# Patient Record
Sex: Female | Born: 1960 | Race: Black or African American | Hispanic: No | Marital: Single | Smoking: Former smoker
Health system: Southern US, Community
[De-identification: ages and names within clinical notes are randomized; demographics above are authoritative.]

## PROBLEM LIST (undated history)

## (undated) DIAGNOSIS — I10 Essential (primary) hypertension: Secondary | ICD-10-CM

## (undated) DIAGNOSIS — E119 Type 2 diabetes mellitus without complications: Secondary | ICD-10-CM

## (undated) DIAGNOSIS — E785 Hyperlipidemia, unspecified: Secondary | ICD-10-CM

## (undated) HISTORY — DX: Hyperlipidemia, unspecified: E78.5

## (undated) HISTORY — DX: Type 2 diabetes mellitus without complications: E11.9

---

## 2001-04-12 ENCOUNTER — Emergency Department (HOSPITAL_COMMUNITY): Admission: EM | Admit: 2001-04-12 | Discharge: 2001-04-12 | Payer: Self-pay | Admitting: Emergency Medicine

## 2001-04-26 ENCOUNTER — Emergency Department (HOSPITAL_COMMUNITY): Admission: AC | Admit: 2001-04-26 | Discharge: 2001-04-26 | Payer: Self-pay

## 2001-04-26 ENCOUNTER — Encounter: Payer: Self-pay | Admitting: Emergency Medicine

## 2014-07-14 ENCOUNTER — Encounter (HOSPITAL_COMMUNITY): Payer: Self-pay | Admitting: *Deleted

## 2014-07-14 ENCOUNTER — Emergency Department (HOSPITAL_COMMUNITY)
Admission: EM | Admit: 2014-07-14 | Discharge: 2014-07-14 | Disposition: A | Discharge status: Home / Self Care | Payer: PRIVATE HEALTH INSURANCE | Attending: Emergency Medicine | Admitting: Emergency Medicine

## 2014-07-14 ENCOUNTER — Emergency Department (HOSPITAL_COMMUNITY): Payer: PRIVATE HEALTH INSURANCE

## 2014-07-14 DIAGNOSIS — Z3202 Encounter for pregnancy test, result negative: Secondary | ICD-10-CM | POA: Diagnosis not present

## 2014-07-14 DIAGNOSIS — E860 Dehydration: Secondary | ICD-10-CM | POA: Insufficient documentation

## 2014-07-14 DIAGNOSIS — J069 Acute upper respiratory infection, unspecified: Secondary | ICD-10-CM | POA: Diagnosis not present

## 2014-07-14 DIAGNOSIS — I10 Essential (primary) hypertension: Secondary | ICD-10-CM | POA: Diagnosis not present

## 2014-07-14 DIAGNOSIS — R55 Syncope and collapse: Secondary | ICD-10-CM | POA: Diagnosis not present

## 2014-07-14 DIAGNOSIS — R05 Cough: Secondary | ICD-10-CM | POA: Diagnosis present

## 2014-07-14 DIAGNOSIS — R0602 Shortness of breath: Secondary | ICD-10-CM

## 2014-07-14 HISTORY — DX: Essential (primary) hypertension: I10

## 2014-07-14 LAB — URINALYSIS, ROUTINE W REFLEX MICROSCOPIC
Bilirubin Urine: NEGATIVE
Glucose, UA: >1000 mg/dL — AB
Hgb urine dipstick: NEGATIVE
Ketones, ur: 15 mg/dL — AB
Leukocytes, UA: NEGATIVE
Nitrite: NEGATIVE
Protein, ur: NEGATIVE mg/dL
Specific Gravity, Urine: 1.026 (ref 1.005–1.030)
Urobilinogen, UA: 0.2 mg/dL (ref 0.0–1.0)
pH: 7 (ref 5.0–8.0)

## 2014-07-14 LAB — BASIC METABOLIC PANEL
ANION GAP: 10 (ref 5–15)
BUN: 7 mg/dL (ref 6–23)
CO2: 22 mmol/L (ref 19–32)
Calcium: 9.2 mg/dL (ref 8.4–10.5)
Chloride: 101 mEq/L (ref 96–112)
Creatinine, Ser: 0.77 mg/dL (ref 0.50–1.10)
GFR calc Af Amer: >90 mL/min (ref >90)
GLUCOSE: 308 mg/dL — AB (ref 70–99)
Potassium: 4.4 mmol/L (ref 3.5–5.1)
SODIUM: 133 mmol/L — AB (ref 135–145)

## 2014-07-14 LAB — CBC
HCT: 34.5 % — ABNORMAL LOW (ref 36.0–46.0)
Hemoglobin: 11.1 g/dL — ABNORMAL LOW (ref 12.0–15.0)
MCH: 25.4 pg — ABNORMAL LOW (ref 26.0–34.0)
MCHC: 32.2 g/dL (ref 30.0–36.0)
MCV: 78.9 fL (ref 78.0–100.0)
Platelets: 225 10*3/uL (ref 150–400)
RBC: 4.37 MIL/uL (ref 3.87–5.11)
RDW: 12.1 % (ref 11.5–15.5)
WBC: 4.5 10*3/uL (ref 4.0–10.5)

## 2014-07-14 LAB — URINE MICROSCOPIC-ADD ON

## 2014-07-14 LAB — I-STAT TROPONIN, ED: TROPONIN I, POC: 0 ng/mL (ref 0.00–0.08)

## 2014-07-14 LAB — POC URINE PREG, ED: Preg Test, Ur: NEGATIVE

## 2014-07-14 MED ORDER — BENZONATATE 100 MG PO CAPS: 100.0000 mg | ORAL_CAPSULE | Freq: Three times a day (TID) | ORAL | Status: DC

## 2014-07-14 MED ORDER — ALBUTEROL SULFATE HFA 108 (90 BASE) MCG/ACT IN AERS
2.0000 | INHALATION_SPRAY | Freq: Once | RESPIRATORY_TRACT | Status: AC
  Administered 2014-07-14: 2 via RESPIRATORY_TRACT
  Filled 2014-07-14: qty 6.7

## 2014-07-14 MED ORDER — IBUPROFEN 800 MG PO TABS
800.0000 mg | ORAL_TABLET | Freq: Once | ORAL | Status: AC
  Administered 2014-07-14: 800 mg via ORAL
  Filled 2014-07-14: qty 1

## 2014-07-14 MED ORDER — SODIUM CHLORIDE 0.9 % IV BOLUS (SEPSIS)
1000.0000 mL | Freq: Once | INTRAVENOUS | Status: AC
Start: 2014-07-14 — End: 2014-07-14
  Administered 2014-07-14: 1000 mL via INTRAVENOUS

## 2014-07-14 NOTE — Discharge Instructions (Signed)
Return to the emergency room with worsening of symptoms, new symptoms or with symptoms that are concerning, especially chest pain, shortness of breath, feel faint, pass out, severe worsening of headache, visual or speech changes, weakness in face, arms or legs. Call to make appointment with your primary care provider as soon as possible to refill your hypertension medicines and also talk about the elevated glucose level while here. Drink plenty of fluids with electrolytes especially Gatorade. OTC cold medications such as mucinex, nyquil, dayquil are recommended. Chloraseptic for sore throat.   Cough, Adult  A cough is a reflex that helps clear your throat and airways. It can help heal the body or may be a reaction to an irritated airway. A cough may only last 2 or 3 weeks (acute) or may last more than 8 weeks (chronic).  CAUSES Acute cough:  Viral or bacterial infections. Chronic cough:  Infections.  Allergies.  Asthma.  Post-nasal drip.  Smoking.  Heartburn or acid reflux.  Some medicines.  Chronic lung problems (COPD).  Cancer. SYMPTOMS   Cough.  Fever.  Chest pain.  Increased breathing rate.  High-pitched whistling sound when breathing (wheezing).  Colored mucus that you cough up (sputum). TREATMENT   A bacterial cough may be treated with antibiotic medicine.  A viral cough must run its course and will not respond to antibiotics.  Your caregiver may recommend other treatments if you have a chronic cough. HOME CARE INSTRUCTIONS   Only take over-the-counter or prescription medicines for pain, discomfort, or fever as directed by your caregiver. Use cough suppressants only as directed by your caregiver.  Use a cold steam vaporizer or humidifier in your bedroom or home to help loosen secretions.  Sleep in a semi-upright position if your cough is worse at night.  Rest as needed.  Stop smoking if you smoke. SEEK IMMEDIATE MEDICAL CARE IF:   You have pus in  your sputum.  Your cough starts to worsen.  You cannot control your cough with suppressants and are losing sleep.  You begin coughing up blood.  You have difficulty breathing.  You develop pain which is getting worse or is uncontrolled with medicine.  You have a fever. MAKE SURE YOU:   Understand these instructions.  Will watch your condition.  Will get help right away if you are not doing well or get worse. Document Released: 12/24/2010 Document Revised: 09/19/2011 Document Reviewed: 12/24/2010 [facility] Patient Information 2015 [place], [place]. This information is not intended to replace advice given to you by your health care provider. Make sure you discuss any questions you have with your health care provider.    Near-Syncope Near-syncope (commonly known as near fainting) is sudden weakness, dizziness, or feeling like you might pass out. During an episode of near-syncope, you may also develop pale skin, have tunnel vision, or feel sick to your stomach (nauseous). Near-syncope may occur when getting up after sitting or while standing for a long time. It is caused by a sudden decrease in blood flow to the brain. This decrease can result from various causes or triggers, most of which are not serious. However, because near-syncope can sometimes be a sign of something serious, a medical evaluation is required. The specific cause is often not determined. HOME CARE INSTRUCTIONS  Monitor your condition for any changes. The following actions may help to alleviate any discomfort you are experiencing:  Have someone stay with you until you feel stable.  Lie down right away and prop your feet up if you  start feeling like you might faint. Breathe deeply and steadily. Wait until all the symptoms have passed. Most of these episodes last only a few minutes. You may feel tired for several hours.   Drink enough fluids to keep your urine clear or pale yellow.   If you are taking blood pressure  or heart medicine, get up slowly when seated or lying down. Take several minutes to sit and then stand. This can reduce dizziness.  Follow up with your health care provider as directed. SEEK IMMEDIATE MEDICAL CARE IF:   You have a severe headache.   You have unusual pain in the chest, abdomen, or back.   You are bleeding from the mouth or rectum, or you have black or tarry stool.   You have an irregular or very fast heartbeat.   You have repeated fainting or have seizure-like jerking during an episode.   You faint when sitting or lying down.   You have confusion.   You have difficulty walking.   You have severe weakness.   You have vision problems.  MAKE SURE YOU:   Understand these instructions.  Will watch your condition.  Will get help right away if you are not doing well or get worse. Document Released: 06/27/2005 Document Revised: 07/02/2013 Document Reviewed: 11/30/2012 [facility] Patient Information 2015 [place], [place]. This information is not intended to replace advice given to you by your health care provider. Make sure you discuss any questions you have with your health care provider.

## 2014-07-14 NOTE — ED Provider Notes (Signed)
CSN: [id number]     Arrival date & time 07/14/14  1132 History   First MD Initiated Contact with Patient 07/14/14 1150     Chief Complaint  Patient presents with  . URI  . Anxiety     (Consider location/radiation/quality/duration/timing/severity/associated sxs/prior Treatment) HPI  Michaela Kim is a 54 y.o. female with PMH of hypertension presenting with couple days of URI symptoms including tactile fevers and cough productive of thick mucus. Patient has been taking over-the-counter remedies with mild improvement. Patient went to work today and said she felt nauseated with mild shortness of breath and felt lightheaded and sat down and the symptoms resolved. She had another episode where she thought she was shaking and sat down and it slowly dissipated. Patient hyperventilating and crying in triage. Patient denies nausea, vomiting, diarrhea, abdominal pain, back pain. Patient with headache over the past couple days without any slurred speech, visual changes, weakness. Patient without any chest pain. She denies any cardiac history.   Past Medical History  Diagnosis Date  . Hypertension    History reviewed. No pertinent past surgical history. History reviewed. No pertinent family history. History  Substance Use Topics  . Smoking status: Former Games developer  . Smokeless tobacco: Not on file  . Alcohol Use: No   OB History    No data available     Review of Systems 10 Systems reviewed and are negative for acute change except as noted in the HPI.    Allergies  Review of patient's allergies indicates no known allergies.  Home Medications   Prior to Admission medications   Medication Sig Start Date End Date Taking? Authorizing Provider  acetaminophen (TYLENOL) 325 MG tablet Take 650 mg by mouth every 6 (six) hours as needed for mild pain.   Yes Historical Provider, MD  benzonatate (TESSALON) 100 MG capsule Take 1 capsule (100 mg total) by mouth every 8 (eight) hours. 07/14/14   Benetta Spar  L Alif Petrak, PA-C   BP 185/64 mmHg  Pulse 91  Temp(Src) 98.9 F (37.2 C) (Oral)  Resp 18  SpO2 100% Physical Exam  Constitutional: She appears well-developed and well-nourished. No distress.  HENT:  Head: Normocephalic and atraumatic.  Mouth/Throat: Oropharynx is clear and moist.  Eyes: Conjunctivae and EOM are normal. Pupils are equal, round, and reactive to light. Right eye exhibits no discharge. Left eye exhibits no discharge.  Neck: Normal range of motion. Neck supple.  No nuchal rigidity  Cardiovascular: Normal rate and regular rhythm.   Pulmonary/Chest: Effort normal and breath sounds normal. No respiratory distress. She has no wheezes.  Abdominal: Soft. Bowel sounds are normal. She exhibits no distension. There is no tenderness.  Neurological: She is alert. No cranial nerve deficit. Coordination normal.  Speech is clear and goal oriented. Peripheral visual fields intact. Strength 5/5 in upper and lower extremities. Sensation intact. Intact rapid alternating movements, finger to nose, and heel to shin. Negative Romberg. No pronator drift. Normal gait.   Skin: Skin is warm and dry. She is not diaphoretic.  Nursing note and vitals reviewed.   ED Course  Procedures (including critical care time) Labs Review Labs Reviewed  CBC - Abnormal; Notable for the following:    Hemoglobin 11.1 (*)    HCT 34.5 (*)    MCH 25.4 (*)    All other components within normal limits  BASIC METABOLIC PANEL - Abnormal; Notable for the following:    Sodium 133 (*)    Glucose, Bld 308 (*)    All other  components within normal limits  URINALYSIS, ROUTINE W REFLEX MICROSCOPIC - Abnormal; Notable for the following:    Glucose, UA >1000 (*)    Ketones, ur 15 (*)    All other components within normal limits  URINE MICROSCOPIC-ADD ON  Rosezena Sensor, ED  POC URINE PREG, ED    Imaging Review Dg Chest 2 View  07/14/2014   CLINICAL DATA:  Shortness of breath for 4 days.  Hypertension.  EXAM: CHEST   2 VIEW  COMPARISON:  None.  FINDINGS: The heart size and mediastinal contours are within normal limits. Both lungs are clear. The visualized skeletal structures are unremarkable.  IMPRESSION: No active cardiopulmonary disease.   Electronically Signed   By: Davonna Belling M.D.   On: 07/14/2014 12:58     EKG Interpretation   Date/Time:  Monday July 14 2014 11:49:19 EST Ventricular Rate:  115 PR Interval:  160 QRS Duration: 82 QT Interval:  332 QTC Calculation: 459 R Axis:   81 Text Interpretation:  Sinus tachycardia Right atrial enlargement Cannot  rule out Anterior infarct , age undetermined T wave abnormality, consider  inferior ischemia Abnormal ECG No prior for comparsions Confirmed by  Gwendolyn Grant  MD, BLAIR ([id number]) on 07/14/2014 11:55:55 AM      MDM   Final diagnoses:  URI (upper respiratory infection)  Near syncope  Essential hypertension   Pt CXR negative for acute infiltrate. Patients symptoms are consistent with URI, likely viral etiology. Discussed that antibiotics are not indicated for viral infections. Pt will be discharged with symptomatic treatment. Pt also with two near syncopal episodes that resolved quickly. Pt without CP, acute headache with neurological symptoms, no abdominal or back pain. SOB has resolved. VSS. Pt not orthostatic and neurological exam without deficits. Pt not pregnant, with mild anemia. No significant electrolyte abnormalities. Troponin negative and EKG without significant abnormalities pt with tachycardia which resolved in ED with fluids. pts near syncope likely related to her viral syndrome and/or dehydration. Patient noted to be hypertensive in the emergency department.  No signs of hypertensive urgency.  Discussed with patient the need for close follow-up and management by their primary care physician. Verbalizes understanding and is agreeable with plan. Pt is hemodynamically stable & in NAD prior to dc.  Discussed return precautions with patient.  Discussed all results and patient verbalizes understanding and agrees with plan.  Case has been discussed with Dr. Gwendolyn Grant who agrees with the above plan and to discharge.       Louann Sjogren, PA-C 07/14/14 1941  Elwin Mocha, MD 07/15/14 [id number]

## 2014-07-14 NOTE — ED Notes (Signed)
Pt reports having cold symptoms, thought she was feeling better pt went to work and then onset of anxiety, sob, feeling lightheaded. Pt hyperventilating at triage and crying, informed pt to take slow deep breaths. spo2 100%. bp 205/81.

## 2014-07-14 NOTE — ED Notes (Signed)
Patient transported to X-ray 

## 2015-04-03 ENCOUNTER — Other Ambulatory Visit: Payer: Self-pay | Admitting: Internal Medicine

## 2015-04-03 ENCOUNTER — Ambulatory Visit
Admission: RE | Admit: 2015-04-03 | Discharge: 2015-04-03 | Disposition: A | Discharge status: Home / Self Care | Payer: PRIVATE HEALTH INSURANCE | Source: Ambulatory Visit | Attending: Internal Medicine | Admitting: Internal Medicine

## 2015-04-03 DIAGNOSIS — R609 Edema, unspecified: Secondary | ICD-10-CM

## 2015-04-03 DIAGNOSIS — T1490XA Injury, unspecified, initial encounter: Secondary | ICD-10-CM

## 2015-04-03 DIAGNOSIS — R52 Pain, unspecified: Secondary | ICD-10-CM

## 2015-09-24 ENCOUNTER — Other Ambulatory Visit: Payer: Self-pay | Admitting: Internal Medicine

## 2015-09-24 DIAGNOSIS — Z1231 Encounter for screening mammogram for malignant neoplasm of breast: Secondary | ICD-10-CM

## 2015-10-14 ENCOUNTER — Ambulatory Visit: Payer: PRIVATE HEALTH INSURANCE

## 2017-08-07 ENCOUNTER — Encounter (INDEPENDENT_AMBULATORY_CARE_PROVIDER_SITE_OTHER): Payer: Medicare Other | Admitting: Ophthalmology

## 2017-08-07 DIAGNOSIS — H35033 Hypertensive retinopathy, bilateral: Secondary | ICD-10-CM

## 2017-08-07 DIAGNOSIS — E113511 Type 2 diabetes mellitus with proliferative diabetic retinopathy with macular edema, right eye: Secondary | ICD-10-CM

## 2017-08-07 DIAGNOSIS — H43813 Vitreous degeneration, bilateral: Secondary | ICD-10-CM

## 2017-08-07 DIAGNOSIS — E11311 Type 2 diabetes mellitus with unspecified diabetic retinopathy with macular edema: Secondary | ICD-10-CM

## 2017-08-07 DIAGNOSIS — H4312 Vitreous hemorrhage, left eye: Secondary | ICD-10-CM | POA: Diagnosis not present

## 2017-08-07 DIAGNOSIS — H2513 Age-related nuclear cataract, bilateral: Secondary | ICD-10-CM

## 2017-08-07 DIAGNOSIS — E113592 Type 2 diabetes mellitus with proliferative diabetic retinopathy without macular edema, left eye: Secondary | ICD-10-CM

## 2017-08-07 DIAGNOSIS — H35371 Puckering of macula, right eye: Secondary | ICD-10-CM

## 2017-08-07 DIAGNOSIS — I10 Essential (primary) hypertension: Secondary | ICD-10-CM | POA: Diagnosis not present

## 2017-08-31 ENCOUNTER — Encounter (INDEPENDENT_AMBULATORY_CARE_PROVIDER_SITE_OTHER): Payer: Medicare Other | Admitting: Ophthalmology

## 2017-08-31 DIAGNOSIS — I10 Essential (primary) hypertension: Secondary | ICD-10-CM

## 2017-08-31 DIAGNOSIS — E113513 Type 2 diabetes mellitus with proliferative diabetic retinopathy with macular edema, bilateral: Secondary | ICD-10-CM

## 2017-08-31 DIAGNOSIS — H43813 Vitreous degeneration, bilateral: Secondary | ICD-10-CM

## 2017-08-31 DIAGNOSIS — H35033 Hypertensive retinopathy, bilateral: Secondary | ICD-10-CM | POA: Diagnosis not present

## 2017-08-31 DIAGNOSIS — H2513 Age-related nuclear cataract, bilateral: Secondary | ICD-10-CM

## 2017-08-31 DIAGNOSIS — E11311 Type 2 diabetes mellitus with unspecified diabetic retinopathy with macular edema: Secondary | ICD-10-CM

## 2017-09-11 ENCOUNTER — Encounter (INDEPENDENT_AMBULATORY_CARE_PROVIDER_SITE_OTHER): Payer: Medicare Other | Admitting: Ophthalmology

## 2017-09-11 DIAGNOSIS — E11311 Type 2 diabetes mellitus with unspecified diabetic retinopathy with macular edema: Secondary | ICD-10-CM | POA: Diagnosis not present

## 2017-09-11 DIAGNOSIS — E113511 Type 2 diabetes mellitus with proliferative diabetic retinopathy with macular edema, right eye: Secondary | ICD-10-CM

## 2017-10-10 ENCOUNTER — Ambulatory Visit
Admission: RE | Admit: 2017-10-10 | Discharge: 2017-10-10 | Disposition: A | Discharge status: Home / Self Care | Payer: Medicare Other | Source: Ambulatory Visit | Attending: Internal Medicine | Admitting: Internal Medicine

## 2017-10-10 ENCOUNTER — Other Ambulatory Visit: Payer: Self-pay | Admitting: Internal Medicine

## 2017-10-10 DIAGNOSIS — R0602 Shortness of breath: Secondary | ICD-10-CM

## 2017-10-12 ENCOUNTER — Encounter (INDEPENDENT_AMBULATORY_CARE_PROVIDER_SITE_OTHER): Payer: Medicare Other | Admitting: Ophthalmology

## 2017-10-19 ENCOUNTER — Encounter (INDEPENDENT_AMBULATORY_CARE_PROVIDER_SITE_OTHER): Payer: Medicare Other | Admitting: Ophthalmology

## 2017-10-19 DIAGNOSIS — E11311 Type 2 diabetes mellitus with unspecified diabetic retinopathy with macular edema: Secondary | ICD-10-CM | POA: Diagnosis not present

## 2017-10-19 DIAGNOSIS — I10 Essential (primary) hypertension: Secondary | ICD-10-CM

## 2017-10-19 DIAGNOSIS — H4311 Vitreous hemorrhage, right eye: Secondary | ICD-10-CM

## 2017-10-19 DIAGNOSIS — H35033 Hypertensive retinopathy, bilateral: Secondary | ICD-10-CM | POA: Diagnosis not present

## 2017-10-19 DIAGNOSIS — E113513 Type 2 diabetes mellitus with proliferative diabetic retinopathy with macular edema, bilateral: Secondary | ICD-10-CM | POA: Diagnosis not present

## 2017-10-19 DIAGNOSIS — H43813 Vitreous degeneration, bilateral: Secondary | ICD-10-CM

## 2017-10-19 DIAGNOSIS — H2513 Age-related nuclear cataract, bilateral: Secondary | ICD-10-CM | POA: Diagnosis not present

## 2017-11-09 ENCOUNTER — Encounter (INDEPENDENT_AMBULATORY_CARE_PROVIDER_SITE_OTHER): Payer: Medicare Other | Admitting: Ophthalmology

## 2017-11-09 DIAGNOSIS — E11311 Type 2 diabetes mellitus with unspecified diabetic retinopathy with macular edema: Secondary | ICD-10-CM

## 2017-11-09 DIAGNOSIS — E113512 Type 2 diabetes mellitus with proliferative diabetic retinopathy with macular edema, left eye: Secondary | ICD-10-CM

## 2017-11-30 ENCOUNTER — Encounter (INDEPENDENT_AMBULATORY_CARE_PROVIDER_SITE_OTHER): Payer: Medicare Other | Admitting: Ophthalmology

## 2017-11-30 DIAGNOSIS — E11311 Type 2 diabetes mellitus with unspecified diabetic retinopathy with macular edema: Secondary | ICD-10-CM

## 2017-11-30 DIAGNOSIS — H35033 Hypertensive retinopathy, bilateral: Secondary | ICD-10-CM | POA: Diagnosis not present

## 2017-11-30 DIAGNOSIS — H2513 Age-related nuclear cataract, bilateral: Secondary | ICD-10-CM

## 2017-11-30 DIAGNOSIS — E113513 Type 2 diabetes mellitus with proliferative diabetic retinopathy with macular edema, bilateral: Secondary | ICD-10-CM | POA: Diagnosis not present

## 2017-11-30 DIAGNOSIS — I10 Essential (primary) hypertension: Secondary | ICD-10-CM | POA: Diagnosis not present

## 2017-11-30 DIAGNOSIS — H43813 Vitreous degeneration, bilateral: Secondary | ICD-10-CM

## 2018-01-04 ENCOUNTER — Encounter (INDEPENDENT_AMBULATORY_CARE_PROVIDER_SITE_OTHER): Payer: Medicare Other | Admitting: Ophthalmology

## 2018-01-04 DIAGNOSIS — I10 Essential (primary) hypertension: Secondary | ICD-10-CM

## 2018-01-04 DIAGNOSIS — H2513 Age-related nuclear cataract, bilateral: Secondary | ICD-10-CM

## 2018-01-04 DIAGNOSIS — H35033 Hypertensive retinopathy, bilateral: Secondary | ICD-10-CM

## 2018-01-04 DIAGNOSIS — E113512 Type 2 diabetes mellitus with proliferative diabetic retinopathy with macular edema, left eye: Secondary | ICD-10-CM | POA: Diagnosis not present

## 2018-01-04 DIAGNOSIS — E113591 Type 2 diabetes mellitus with proliferative diabetic retinopathy without macular edema, right eye: Secondary | ICD-10-CM | POA: Diagnosis not present

## 2018-01-04 DIAGNOSIS — H43813 Vitreous degeneration, bilateral: Secondary | ICD-10-CM

## 2018-01-04 DIAGNOSIS — E11311 Type 2 diabetes mellitus with unspecified diabetic retinopathy with macular edema: Secondary | ICD-10-CM

## 2018-01-22 ENCOUNTER — Encounter (HOSPITAL_COMMUNITY)
Admission: RE | Disposition: A | Discharge status: Home / Self Care | Payer: Self-pay | Source: Ambulatory Visit | Attending: Gastroenterology

## 2018-01-22 ENCOUNTER — Encounter (HOSPITAL_COMMUNITY): Payer: Self-pay | Admitting: *Deleted

## 2018-01-22 ENCOUNTER — Ambulatory Visit (HOSPITAL_COMMUNITY)
Admission: RE | Admit: 2018-01-22 | Discharge: 2018-01-22 | Disposition: A | Discharge status: Home / Self Care | Payer: Medicare Other | Source: Ambulatory Visit | Attending: Gastroenterology | Admitting: Gastroenterology

## 2018-01-22 DIAGNOSIS — D509 Iron deficiency anemia, unspecified: Secondary | ICD-10-CM | POA: Diagnosis present

## 2018-01-22 DIAGNOSIS — K5520 Angiodysplasia of colon without hemorrhage: Secondary | ICD-10-CM | POA: Insufficient documentation

## 2018-01-22 HISTORY — PX: GIVENS CAPSULE STUDY: SHX5432

## 2018-01-22 SURGERY — IMAGING PROCEDURE, GI TRACT, INTRALUMINAL, VIA CAPSULE
Anesthesia: LOCAL

## 2018-01-22 MED ORDER — SODIUM CHLORIDE 0.9 % IV SOLN: INTRAVENOUS | Status: DC

## 2018-01-22 SURGICAL SUPPLY — 1 items: TOWEL COTTON PACK 4EA (MISCELLANEOUS) ×4 IMPLANT

## 2018-01-22 NOTE — H&P (Signed)
  Michaela Kim HPI: The patient has an IDA.  The work up with the EGD/Colonoscopy were negative.  Past Medical History:  Diagnosis Date  . Hypertension     History reviewed. No pertinent surgical history.  History reviewed. No pertinent family history.  Social History:  reports that she has quit smoking. She does not have any smokeless tobacco history on file. She reports that she does not drink alcohol or use drugs.  Allergies: No Known Allergies  Medications:  Scheduled:  Continuous: . sodium chloride      No results found for this or any previous visit (from the past 24 hour(s)).   No results found.  ROS:  As stated above in the HPI otherwise negative.  There were no vitals taken for this visit.    PE: Not performed.  Nursing administered testing.  Assessment/Plan: 1) IDA - capsule endoscopy  Michaela Kim D 01/22/2018, 9:17 AM

## 2018-02-01 ENCOUNTER — Encounter (INDEPENDENT_AMBULATORY_CARE_PROVIDER_SITE_OTHER): Payer: Medicare Other | Admitting: Ophthalmology

## 2018-02-01 DIAGNOSIS — E113591 Type 2 diabetes mellitus with proliferative diabetic retinopathy without macular edema, right eye: Secondary | ICD-10-CM

## 2018-02-01 DIAGNOSIS — I10 Essential (primary) hypertension: Secondary | ICD-10-CM | POA: Diagnosis not present

## 2018-02-01 DIAGNOSIS — E11311 Type 2 diabetes mellitus with unspecified diabetic retinopathy with macular edema: Secondary | ICD-10-CM

## 2018-02-01 DIAGNOSIS — E113512 Type 2 diabetes mellitus with proliferative diabetic retinopathy with macular edema, left eye: Secondary | ICD-10-CM | POA: Diagnosis not present

## 2018-02-01 DIAGNOSIS — H2513 Age-related nuclear cataract, bilateral: Secondary | ICD-10-CM

## 2018-02-01 DIAGNOSIS — H43813 Vitreous degeneration, bilateral: Secondary | ICD-10-CM

## 2018-02-01 DIAGNOSIS — H35033 Hypertensive retinopathy, bilateral: Secondary | ICD-10-CM

## 2018-02-02 ENCOUNTER — Other Ambulatory Visit: Payer: Self-pay | Admitting: Gastroenterology

## 2018-02-19 ENCOUNTER — Encounter (INDEPENDENT_AMBULATORY_CARE_PROVIDER_SITE_OTHER): Payer: Medicare Other | Admitting: Ophthalmology

## 2018-02-19 DIAGNOSIS — E11311 Type 2 diabetes mellitus with unspecified diabetic retinopathy with macular edema: Secondary | ICD-10-CM | POA: Diagnosis not present

## 2018-02-19 DIAGNOSIS — E113591 Type 2 diabetes mellitus with proliferative diabetic retinopathy without macular edema, right eye: Secondary | ICD-10-CM

## 2018-02-23 ENCOUNTER — Ambulatory Visit (HOSPITAL_COMMUNITY)
Admission: RE | Admit: 2018-02-23 | Discharge: 2018-02-23 | Disposition: A | Discharge status: Home / Self Care | Payer: Medicare Other | Source: Ambulatory Visit | Attending: Gastroenterology | Admitting: Gastroenterology

## 2018-02-23 ENCOUNTER — Ambulatory Visit (HOSPITAL_COMMUNITY): Payer: Medicare Other | Admitting: Registered Nurse

## 2018-02-23 ENCOUNTER — Encounter (HOSPITAL_COMMUNITY)
Admission: RE | Disposition: A | Discharge status: Home / Self Care | Payer: Self-pay | Source: Ambulatory Visit | Attending: Gastroenterology

## 2018-02-23 ENCOUNTER — Encounter (HOSPITAL_COMMUNITY): Payer: Self-pay | Admitting: *Deleted

## 2018-02-23 ENCOUNTER — Other Ambulatory Visit: Payer: Self-pay

## 2018-02-23 DIAGNOSIS — K922 Gastrointestinal hemorrhage, unspecified: Secondary | ICD-10-CM | POA: Diagnosis not present

## 2018-02-23 DIAGNOSIS — Z7984 Long term (current) use of oral hypoglycemic drugs: Secondary | ICD-10-CM | POA: Diagnosis not present

## 2018-02-23 DIAGNOSIS — K3189 Other diseases of stomach and duodenum: Secondary | ICD-10-CM | POA: Insufficient documentation

## 2018-02-23 DIAGNOSIS — Z79899 Other long term (current) drug therapy: Secondary | ICD-10-CM | POA: Insufficient documentation

## 2018-02-23 DIAGNOSIS — K6389 Other specified diseases of intestine: Secondary | ICD-10-CM | POA: Insufficient documentation

## 2018-02-23 DIAGNOSIS — Z87891 Personal history of nicotine dependence: Secondary | ICD-10-CM | POA: Diagnosis not present

## 2018-02-23 DIAGNOSIS — D509 Iron deficiency anemia, unspecified: Secondary | ICD-10-CM | POA: Insufficient documentation

## 2018-02-23 DIAGNOSIS — I10 Essential (primary) hypertension: Secondary | ICD-10-CM | POA: Insufficient documentation

## 2018-02-23 DIAGNOSIS — R933 Abnormal findings on diagnostic imaging of other parts of digestive tract: Secondary | ICD-10-CM | POA: Diagnosis present

## 2018-02-23 HISTORY — PX: ENTEROSCOPY: SHX5533

## 2018-02-23 HISTORY — PX: HOT HEMOSTASIS: SHX5433

## 2018-02-23 LAB — GLUCOSE, CAPILLARY: GLUCOSE-CAPILLARY: 125 mg/dL — AB (ref 70–99)

## 2018-02-23 SURGERY — ENTEROSCOPY
Anesthesia: Monitor Anesthesia Care

## 2018-02-23 MED ORDER — PROPOFOL 500 MG/50ML IV EMUL
INTRAVENOUS | Status: DC | PRN
  Administered 2018-02-23: 140 ug/kg/min via INTRAVENOUS

## 2018-02-23 MED ORDER — LACTATED RINGERS IV SOLN
INTRAVENOUS | Status: DC
  Administered 2018-02-23: 1000 mL via INTRAVENOUS

## 2018-02-23 MED ORDER — PROPOFOL 10 MG/ML IV BOLUS
INTRAVENOUS | Status: AC
  Filled 2018-02-23: qty 20

## 2018-02-23 MED ORDER — SODIUM CHLORIDE 0.9 % IV SOLN: INTRAVENOUS | Status: DC

## 2018-02-23 MED ORDER — ONDANSETRON HCL 4 MG/2ML IJ SOLN
INTRAMUSCULAR | Status: DC | PRN
  Administered 2018-02-23: 4 mg via INTRAVENOUS

## 2018-02-23 MED ORDER — PROPOFOL 10 MG/ML IV BOLUS
INTRAVENOUS | Status: DC | PRN
  Administered 2018-02-23: 20 mg via INTRAVENOUS

## 2018-02-23 MED ORDER — PROPOFOL 10 MG/ML IV BOLUS
INTRAVENOUS | Status: AC
  Filled 2018-02-23: qty 40

## 2018-02-23 NOTE — H&P (Signed)
  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  The VCE was performed for findings of IDA.  Past Medical History:  Diagnosis Date  . Hypertension     Past Surgical History:  Procedure Laterality Date  . GIVENS CAPSULE STUDY N/A 01/22/2018   Procedure: GIVENS CAPSULE STUDY;  Surgeon: Michaela Kim, Michaela Denes, MD;  [facility];  Service: Endoscopy;  Laterality: N/A;    History reviewed. No pertinent family history.  Social History:  reports that she has quit smoking. She has never used smokeless tobacco. She reports that she does not drink alcohol or use drugs.  Allergies: No Known Allergies  Medications:  Scheduled:  Continuous: . sodium chloride      Results for orders placed or performed during the hospital encounter of 02/23/18 (from the past 24 hour(s))  Glucose, capillary     Status: Abnormal   Collection Time: 02/23/18  9:45 AM  Result Value Ref Range   Glucose-Capillary 125 (H) 70 - 99 mg/dL     No results found.  ROS:  As stated above in the HPI otherwise negative.  Blood pressure 140/88, pulse 71, temperature 98.5 F (36.9 C), temperature source Oral, resp. rate 11, height 5\' 3"  (1.6 m), weight 98.9 kg, SpO2 96 %.    PE: Gen: NAD, Alert and Oriented HEENT:  [place]/AT, EOMI Neck: Supple, no LAD Lungs: CTA Bilaterally CV: RRR without M/G/R ABM: Soft, NTND, +BS Ext: No C/C/E  Assessment/Plan: 1) AVMs and IDA - Enteroscopy with APC.  Michaela Kim D 02/23/2018, 10:01 AM

## 2018-02-23 NOTE — Op Note (Signed)
[facility] Name: Michaela Kim Procedure Date: 02/23/2018 MRN: [id number] Attending MD: Jeani HawkingPatrick Burnice Oestreicher , MD Date of Birth: 1961-01-22 CSN: [id number] Age: 57 Admit Type: Outpatient Procedure:                Small bowel enteroscopy Indications:              Abnormal video capsule endoscopy Providers:                Jeani HawkingPatrick Jakyah Bradby, MD, Norman ClayLisa Nunn, RN, Kandice RobinsonsGuillaume Awaka,                            Technician Referring MD:              Medicines:                Propofol per Anesthesia Complications:            No immediate complications. Estimated Blood Loss:     Estimated blood loss: none. Procedure:                Pre-Anesthesia Assessment:                           - Prior to the procedure, a History and Physical                            was performed, and patient medications and                            allergies were reviewed. The patient's tolerance of                            previous anesthesia was also reviewed. The risks                            and benefits of the procedure and the sedation                            options and risks were discussed with the patient.                            All questions were answered, and informed consent                            was obtained. Prior Anticoagulants: The patient has                            taken no previous anticoagulant or antiplatelet                            agents. ASA Grade Assessment: II - A patient with                            mild systemic disease. After reviewing the risks  and benefits, the patient was deemed in                            satisfactory condition to undergo the procedure.                           - Sedation was administered by an anesthesia                            professional. Deep sedation was attained.                           After obtaining informed consent, the endoscope was                            passed under direct  vision. Throughout the                            procedure, the patient's blood pressure, pulse, and                            oxygen saturations were monitored continuously. The                            [id number]-[id number] ([id number] Olympus peds colonoscope was                            introduced through the mouth and advanced to the                            proximal jejunum. The small bowel enteroscopy was                            accomplished without difficulty. The patient                            tolerated the procedure well. Scope In: Scope Out: Findings:      The esophagus was normal.      The stomach was normal.      Multiple spots with hematin (altered blood/coffee-ground-like material)       were found in the duodenal bulb. Coagulation for tissue destruction       using monopolar probe was successful.      A few spots with no bleeding were found in the proximal jejunum.       Coagulation for tissue destruction using monopolar probe was successful.      An overt AVM was not identified, but there was evidence of some minor       bleeding in the duodenal bulb. Impression:               - Normal esophagus.                           - Normal stomach.                           -  Multiple spots with hematin (altered                            blood/coffee-ground-like material) in the duodenum.                            Treated with a monopolar probe.                           - Jejunal vascular lesion. Treated with a monopolar                            probe.                           - No specimens collected. Recommendation:            Procedure Code(s):        --- Professional ---                           [phone], Small intestinal endoscopy, enteroscopy                            beyond second portion of duodenum, not including                            ileum; with ablation of tumor(s), polyp(s), or                            other lesion(s) not amenable to removal by hot                             biopsy forceps, bipolar cautery or snare technique Diagnosis Code(s):        --- Professional ---                           K92.2, Gastrointestinal hemorrhage, unspecified                           K63.89, Other specified diseases of intestine                           R93.3, Abnormal findings on diagnostic imaging of                            other parts of digestive tract CPT copyright 2017 American Medical Association. All rights reserved. The codes documented in this report are preliminary and upon coder review may  be revised to meet current compliance requirements. Jeani Hawking, MD Jeani Hawking, MD 02/23/2018 10:56:06 AM This report has been signed electronically. Number of Addenda: 0

## 2018-02-23 NOTE — Discharge Instructions (Signed)

## 2018-02-23 NOTE — Transfer of Care (Signed)
Immediate Anesthesia Transfer of Care Note  Patient: Michaela Kim  Procedure(s) Performed: ENTEROSCOPY (N/A ) HOT HEMOSTASIS (ARGON PLASMA COAGULATION/BICAP) (N/A )  Patient Location: PACU  Anesthesia Type:MAC  Level of Consciousness: awake, alert  and oriented  Airway & Oxygen Therapy: Patient Spontanous Breathing and Patient connected to nasal cannula oxygen  Post-op Assessment: Report given to RN and Post -op Vital signs reviewed and stable  Post vital signs: Reviewed and stable  Last Vitals:  Vitals Value Taken Time  BP    Temp    Pulse    Resp 15 02/23/2018 10:53 AM  SpO2    Vitals shown include unvalidated device data.  Last Pain:  Vitals:   02/23/18 0930  TempSrc: Oral  PainSc: 0-No pain         Complications: No apparent anesthesia complications

## 2018-02-23 NOTE — Anesthesia Preprocedure Evaluation (Addendum)
Anesthesia Evaluation  Patient identified by MRN, date of birth, ID band Patient awake    Reviewed: Allergy & Precautions, NPO status   Airway Mallampati: II  TM Distance: >3 FB     Dental   Pulmonary former smoker,    breath sounds clear to auscultation       Cardiovascular hypertension,  Rhythm:Regular Rate:Normal     Neuro/Psych    GI/Hepatic Neg liver ROS, History noted. CG   Endo/Other  negative endocrine ROS  Renal/GU negative Renal ROS     Musculoskeletal   Abdominal   Peds  Hematology   Anesthesia Other Findings   Reproductive/Obstetrics                            Anesthesia Physical Anesthesia Plan  ASA: III  Anesthesia Plan: MAC   Post-op Pain Management:    Induction:   PONV Risk Score and Plan: Treatment may vary due to age or medical condition  Airway Management Planned: Simple Face Mask and Nasal Cannula  Additional Equipment:   Intra-op Plan:   Post-operative Plan:   Informed Consent: I have reviewed the patients History and Physical, chart, labs and discussed the procedure including the risks, benefits and alternatives for the proposed anesthesia with the patient or authorized representative who has indicated his/her understanding and acceptance.   Dental advisory given  Plan Discussed with: CRNA and Anesthesiologist  Anesthesia Plan Comments:        Anesthesia Quick Evaluation

## 2018-02-23 NOTE — Anesthesia Postprocedure Evaluation (Signed)
Anesthesia Post Note  Patient: Michaela Kim  Procedure(s) Performed: ENTEROSCOPY (N/A ) HOT HEMOSTASIS (ARGON PLASMA COAGULATION/BICAP) (N/A )     Patient location during evaluation: PACU Anesthesia Type: MAC Level of consciousness: awake Pain management: pain level controlled Respiratory status: spontaneous breathing Cardiovascular status: stable Anesthetic complications: no    Last Vitals:  Vitals:   02/23/18 1053 02/23/18 1100  BP: (!) 119/54 116/71  Pulse: 71 61  Resp: 15 17  Temp: 36.6 C   SpO2: 100% 100%    Last Pain:  Vitals:   02/23/18 1053  TempSrc: Oral  PainSc: 0-No pain                 Corderius Saraceni

## 2018-02-26 ENCOUNTER — Encounter (HOSPITAL_COMMUNITY): Payer: Self-pay | Admitting: Gastroenterology

## 2018-03-08 ENCOUNTER — Encounter (INDEPENDENT_AMBULATORY_CARE_PROVIDER_SITE_OTHER): Payer: Medicare Other | Admitting: Ophthalmology

## 2018-03-14 ENCOUNTER — Encounter (INDEPENDENT_AMBULATORY_CARE_PROVIDER_SITE_OTHER): Payer: Medicare Other | Admitting: Ophthalmology

## 2018-03-14 DIAGNOSIS — E113513 Type 2 diabetes mellitus with proliferative diabetic retinopathy with macular edema, bilateral: Secondary | ICD-10-CM | POA: Diagnosis not present

## 2018-03-14 DIAGNOSIS — H43813 Vitreous degeneration, bilateral: Secondary | ICD-10-CM

## 2018-03-14 DIAGNOSIS — H2513 Age-related nuclear cataract, bilateral: Secondary | ICD-10-CM

## 2018-03-14 DIAGNOSIS — E11311 Type 2 diabetes mellitus with unspecified diabetic retinopathy with macular edema: Secondary | ICD-10-CM | POA: Diagnosis not present

## 2018-03-14 DIAGNOSIS — I10 Essential (primary) hypertension: Secondary | ICD-10-CM

## 2018-03-14 DIAGNOSIS — H35033 Hypertensive retinopathy, bilateral: Secondary | ICD-10-CM | POA: Diagnosis not present

## 2018-04-18 ENCOUNTER — Encounter (INDEPENDENT_AMBULATORY_CARE_PROVIDER_SITE_OTHER): Payer: Medicare Other | Admitting: Ophthalmology

## 2018-04-18 DIAGNOSIS — E113513 Type 2 diabetes mellitus with proliferative diabetic retinopathy with macular edema, bilateral: Secondary | ICD-10-CM

## 2018-04-18 DIAGNOSIS — I10 Essential (primary) hypertension: Secondary | ICD-10-CM | POA: Diagnosis not present

## 2018-04-18 DIAGNOSIS — H35033 Hypertensive retinopathy, bilateral: Secondary | ICD-10-CM | POA: Diagnosis not present

## 2018-04-18 DIAGNOSIS — E11311 Type 2 diabetes mellitus with unspecified diabetic retinopathy with macular edema: Secondary | ICD-10-CM | POA: Diagnosis not present

## 2018-04-18 DIAGNOSIS — H2513 Age-related nuclear cataract, bilateral: Secondary | ICD-10-CM

## 2018-04-18 DIAGNOSIS — H43813 Vitreous degeneration, bilateral: Secondary | ICD-10-CM

## 2018-05-08 ENCOUNTER — Other Ambulatory Visit: Payer: Self-pay | Admitting: Internal Medicine

## 2018-05-08 DIAGNOSIS — Z1231 Encounter for screening mammogram for malignant neoplasm of breast: Secondary | ICD-10-CM

## 2018-05-23 ENCOUNTER — Encounter (INDEPENDENT_AMBULATORY_CARE_PROVIDER_SITE_OTHER): Payer: Medicare Other | Admitting: Ophthalmology

## 2018-05-23 DIAGNOSIS — E11311 Type 2 diabetes mellitus with unspecified diabetic retinopathy with macular edema: Secondary | ICD-10-CM | POA: Diagnosis not present

## 2018-05-23 DIAGNOSIS — E113513 Type 2 diabetes mellitus with proliferative diabetic retinopathy with macular edema, bilateral: Secondary | ICD-10-CM | POA: Diagnosis not present

## 2018-05-23 DIAGNOSIS — H35033 Hypertensive retinopathy, bilateral: Secondary | ICD-10-CM | POA: Diagnosis not present

## 2018-05-23 DIAGNOSIS — H43813 Vitreous degeneration, bilateral: Secondary | ICD-10-CM

## 2018-05-23 DIAGNOSIS — I10 Essential (primary) hypertension: Secondary | ICD-10-CM

## 2018-06-20 ENCOUNTER — Ambulatory Visit
Admission: RE | Admit: 2018-06-20 | Discharge: 2018-06-20 | Disposition: A | Discharge status: Home / Self Care | Payer: Medicare Other | Source: Ambulatory Visit | Attending: Internal Medicine | Admitting: Internal Medicine

## 2018-06-20 DIAGNOSIS — Z1231 Encounter for screening mammogram for malignant neoplasm of breast: Secondary | ICD-10-CM

## 2018-06-21 ENCOUNTER — Encounter (INDEPENDENT_AMBULATORY_CARE_PROVIDER_SITE_OTHER): Payer: Medicare Other | Admitting: Ophthalmology

## 2018-06-21 DIAGNOSIS — H2513 Age-related nuclear cataract, bilateral: Secondary | ICD-10-CM

## 2018-06-21 DIAGNOSIS — E11311 Type 2 diabetes mellitus with unspecified diabetic retinopathy with macular edema: Secondary | ICD-10-CM

## 2018-06-21 DIAGNOSIS — H35033 Hypertensive retinopathy, bilateral: Secondary | ICD-10-CM | POA: Diagnosis not present

## 2018-06-21 DIAGNOSIS — I10 Essential (primary) hypertension: Secondary | ICD-10-CM

## 2018-06-21 DIAGNOSIS — H43813 Vitreous degeneration, bilateral: Secondary | ICD-10-CM

## 2018-06-21 DIAGNOSIS — E113513 Type 2 diabetes mellitus with proliferative diabetic retinopathy with macular edema, bilateral: Secondary | ICD-10-CM

## 2018-07-19 ENCOUNTER — Encounter (INDEPENDENT_AMBULATORY_CARE_PROVIDER_SITE_OTHER): Payer: Medicare Other | Admitting: Ophthalmology

## 2018-07-30 ENCOUNTER — Ambulatory Visit
Admission: RE | Admit: 2018-07-30 | Discharge: 2018-07-30 | Disposition: A | Discharge status: Home / Self Care | Payer: Medicare Other | Source: Ambulatory Visit | Attending: Internal Medicine | Admitting: Internal Medicine

## 2018-07-30 ENCOUNTER — Other Ambulatory Visit: Payer: Self-pay | Admitting: Internal Medicine

## 2018-07-30 DIAGNOSIS — R52 Pain, unspecified: Secondary | ICD-10-CM

## 2018-08-07 ENCOUNTER — Encounter (INDEPENDENT_AMBULATORY_CARE_PROVIDER_SITE_OTHER): Payer: Medicare Other | Admitting: Ophthalmology

## 2018-08-07 DIAGNOSIS — E113513 Type 2 diabetes mellitus with proliferative diabetic retinopathy with macular edema, bilateral: Secondary | ICD-10-CM | POA: Diagnosis not present

## 2018-08-07 DIAGNOSIS — H43813 Vitreous degeneration, bilateral: Secondary | ICD-10-CM

## 2018-08-07 DIAGNOSIS — I10 Essential (primary) hypertension: Secondary | ICD-10-CM

## 2018-08-07 DIAGNOSIS — H2513 Age-related nuclear cataract, bilateral: Secondary | ICD-10-CM

## 2018-08-07 DIAGNOSIS — E11311 Type 2 diabetes mellitus with unspecified diabetic retinopathy with macular edema: Secondary | ICD-10-CM | POA: Diagnosis not present

## 2018-08-07 DIAGNOSIS — H35033 Hypertensive retinopathy, bilateral: Secondary | ICD-10-CM

## 2018-09-04 ENCOUNTER — Encounter (INDEPENDENT_AMBULATORY_CARE_PROVIDER_SITE_OTHER): Payer: Medicare Other | Admitting: Ophthalmology

## 2018-09-04 DIAGNOSIS — I10 Essential (primary) hypertension: Secondary | ICD-10-CM | POA: Diagnosis not present

## 2018-09-04 DIAGNOSIS — E113513 Type 2 diabetes mellitus with proliferative diabetic retinopathy with macular edema, bilateral: Secondary | ICD-10-CM

## 2018-09-04 DIAGNOSIS — H35033 Hypertensive retinopathy, bilateral: Secondary | ICD-10-CM | POA: Diagnosis not present

## 2018-09-04 DIAGNOSIS — E11311 Type 2 diabetes mellitus with unspecified diabetic retinopathy with macular edema: Secondary | ICD-10-CM | POA: Diagnosis not present

## 2018-09-04 DIAGNOSIS — H43813 Vitreous degeneration, bilateral: Secondary | ICD-10-CM

## 2018-09-04 DIAGNOSIS — H2513 Age-related nuclear cataract, bilateral: Secondary | ICD-10-CM

## 2018-10-02 ENCOUNTER — Encounter (INDEPENDENT_AMBULATORY_CARE_PROVIDER_SITE_OTHER): Payer: Medicare Other | Admitting: Ophthalmology

## 2018-10-26 ENCOUNTER — Other Ambulatory Visit: Payer: Self-pay | Admitting: Cardiology

## 2018-10-30 ENCOUNTER — Encounter (INDEPENDENT_AMBULATORY_CARE_PROVIDER_SITE_OTHER): Payer: Medicare Other | Admitting: Ophthalmology

## 2019-04-12 IMAGING — MG DIGITAL SCREENING BILATERAL MAMMOGRAM WITH TOMO AND CAD
8 series · 8 of 24 positions shown · non-contrast
Comparison: None.

CLINICAL DATA: Screening.

EXAM:
DIGITAL SCREENING BILATERAL MAMMOGRAM WITH TOMO AND CAD

[R CC synth-2D]
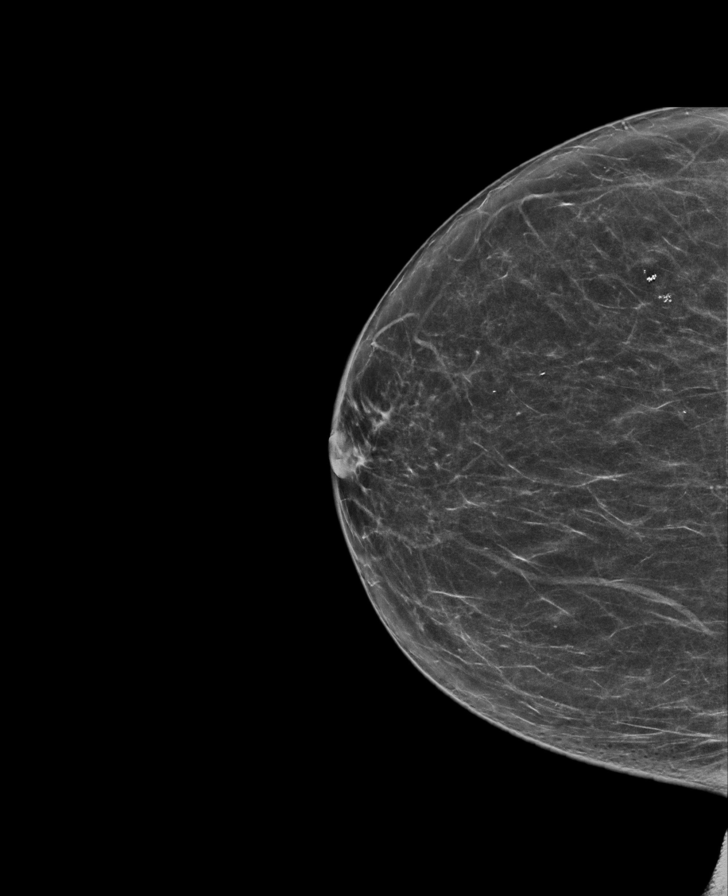

[L MLO synth-2D]
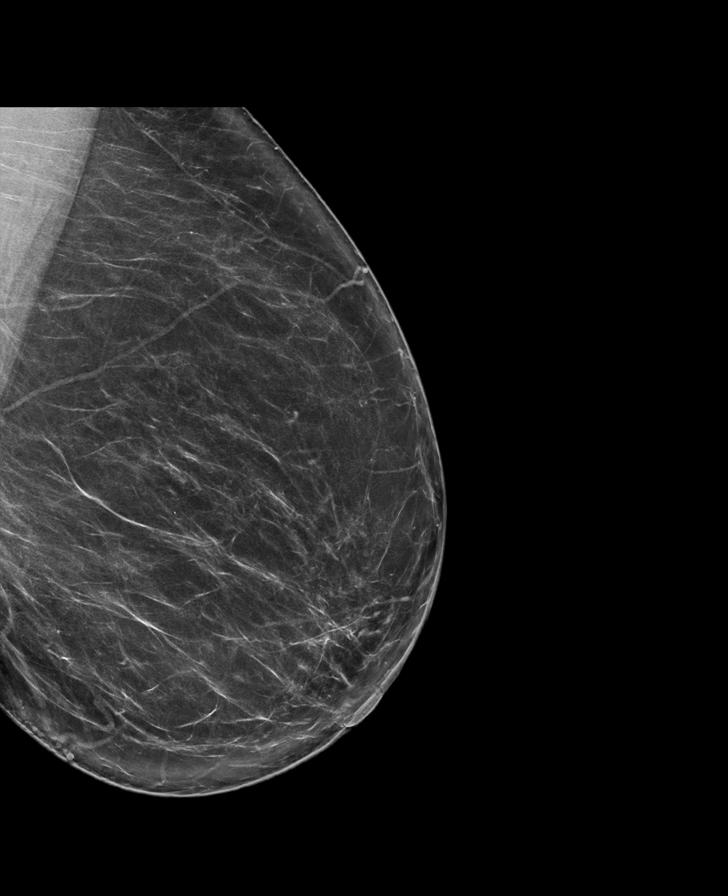

[R MLO synth-2D]
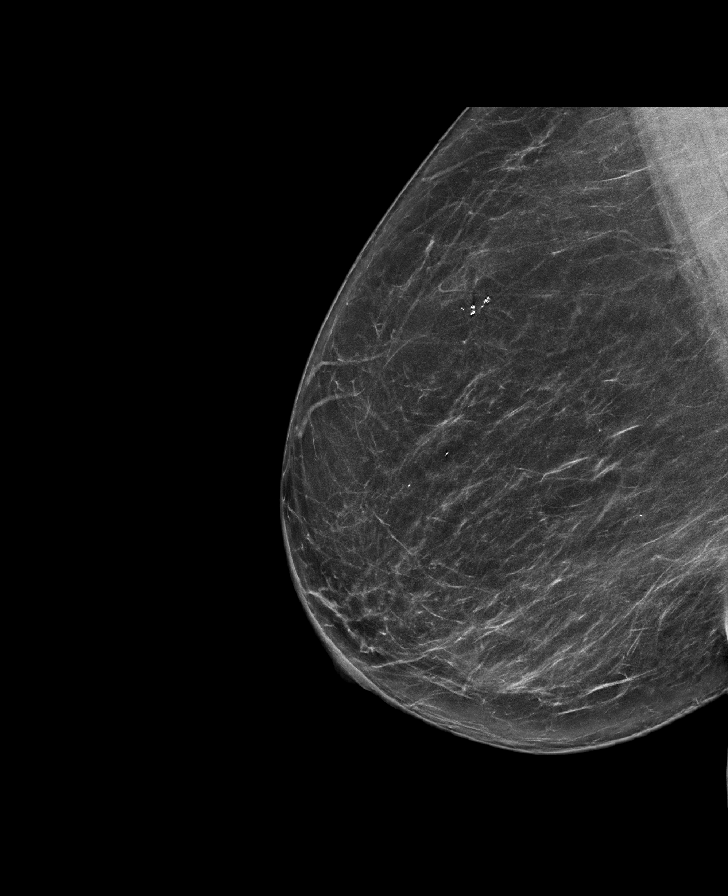

[L CC synth-2D]
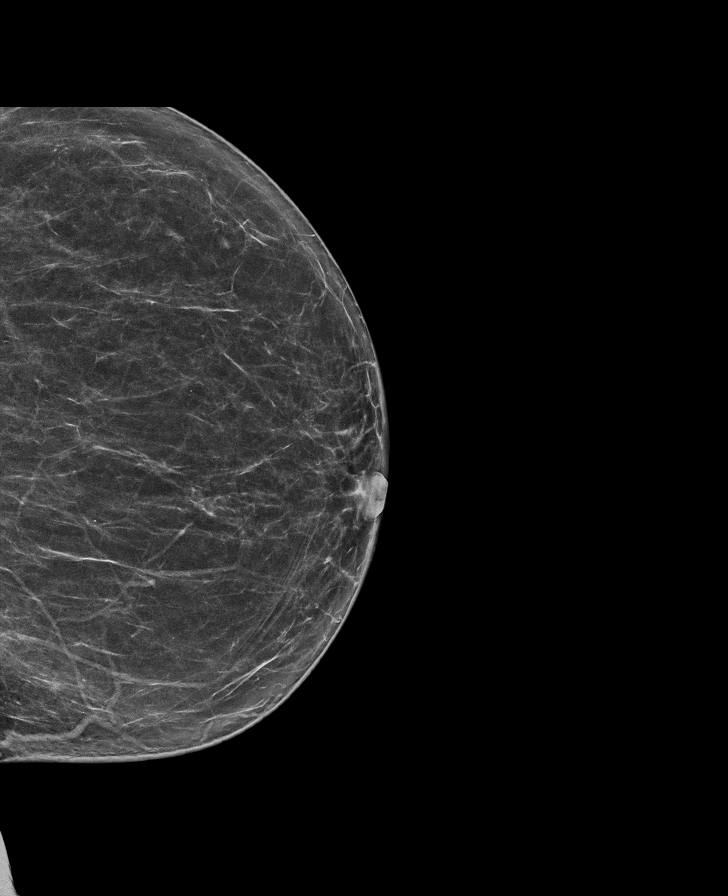

[R MLO tomo · tomo slice 39/77.0]
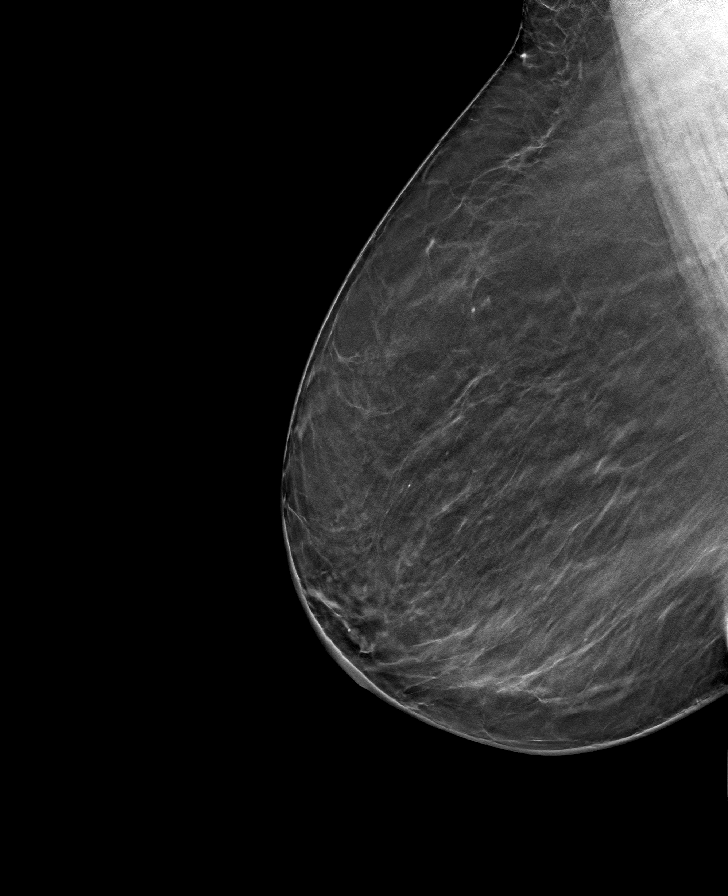

[L MLO tomo · tomo slice 42/83.0]
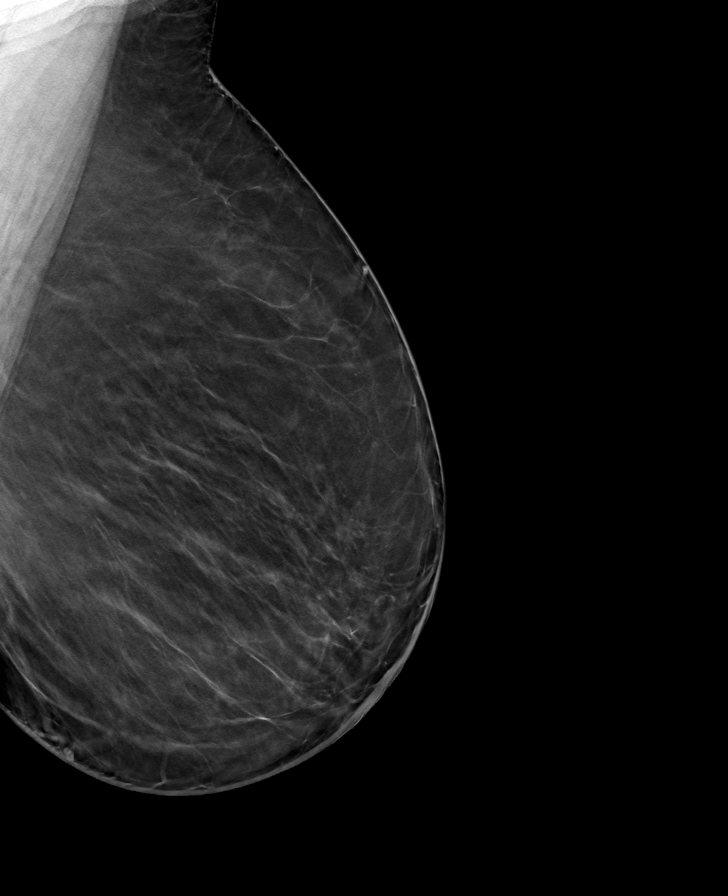

[L CC tomo · tomo slice 35/68.0]
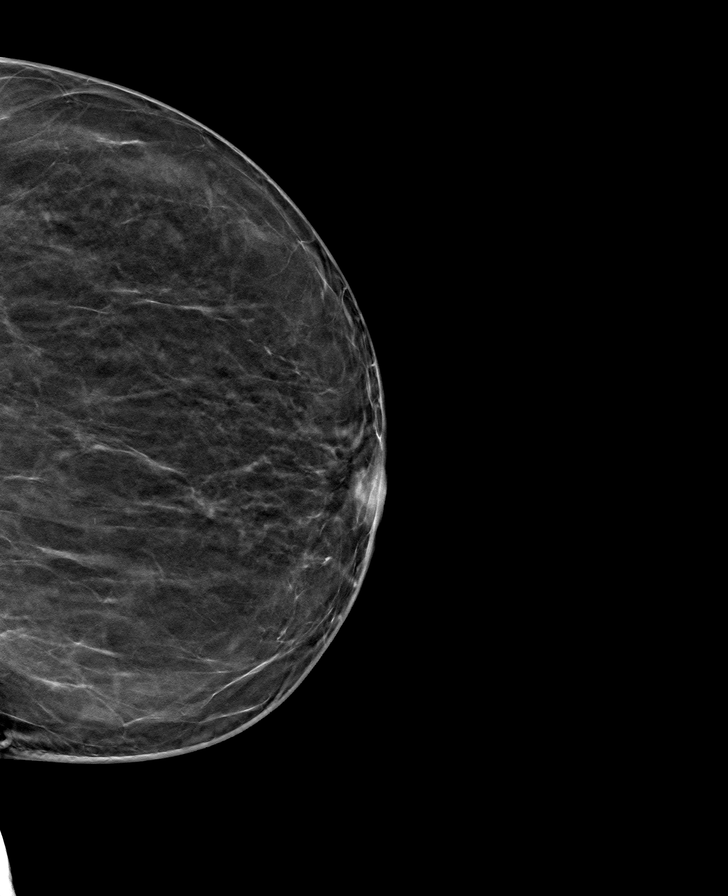

[R CC tomo · tomo slice 35/68.0]
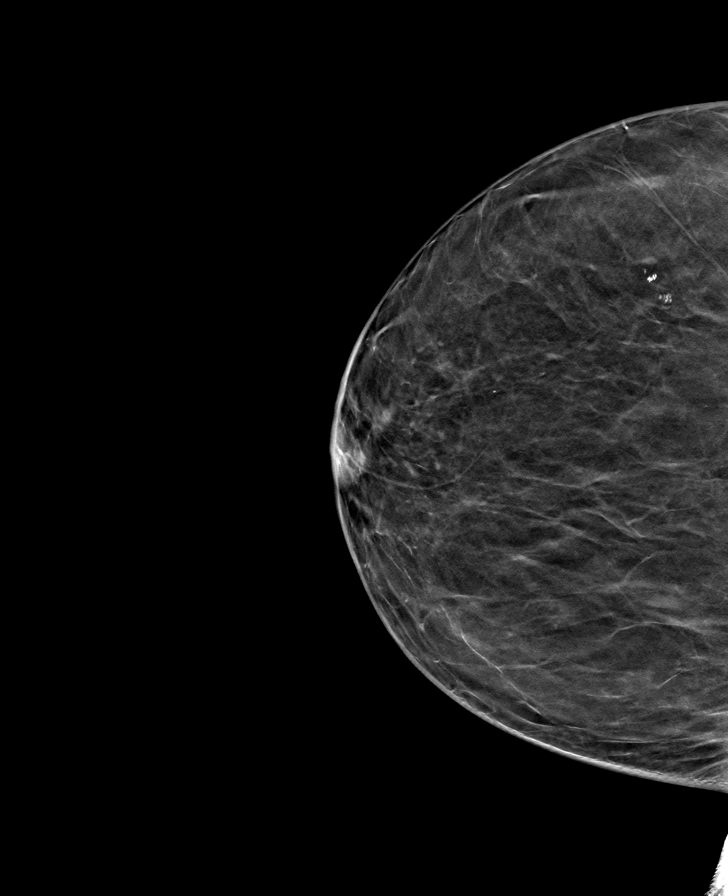

[8 of 24 positions shown; findings below may reference images not displayed]

ACR Breast Density Category b: There are scattered areas of
fibroglandular density.
FINDINGS: There are no findings suspicious for malignancy. Images were
processed with CAD.
IMPRESSION: No mammographic evidence of malignancy. A result letter of this
screening mammogram will be mailed directly to the patient.

RECOMMENDATION:
Screening mammogram in one year. (Code:Y5-G-EJ6)

BI-RADS CATEGORY  1: Negative.

## 2019-04-15 ENCOUNTER — Other Ambulatory Visit: Payer: Self-pay | Admitting: Cardiology

## 2019-06-13 ENCOUNTER — Other Ambulatory Visit: Payer: Self-pay | Admitting: Internal Medicine

## 2019-06-13 ENCOUNTER — Other Ambulatory Visit: Payer: Self-pay

## 2019-06-13 DIAGNOSIS — Z1231 Encounter for screening mammogram for malignant neoplasm of breast: Secondary | ICD-10-CM

## 2019-08-01 ENCOUNTER — Ambulatory Visit
Admission: RE | Admit: 2019-08-01 | Discharge: 2019-08-01 | Disposition: A | Discharge status: Home / Self Care | Payer: Medicare Other | Source: Ambulatory Visit | Attending: Internal Medicine | Admitting: Internal Medicine

## 2019-08-01 ENCOUNTER — Other Ambulatory Visit: Payer: Self-pay

## 2019-08-01 DIAGNOSIS — Z1231 Encounter for screening mammogram for malignant neoplasm of breast: Secondary | ICD-10-CM

## 2019-10-12 ENCOUNTER — Other Ambulatory Visit: Payer: Self-pay | Admitting: Cardiology

## 2020-04-09 ENCOUNTER — Other Ambulatory Visit: Payer: Self-pay | Admitting: Cardiology

## 2020-10-06 ENCOUNTER — Other Ambulatory Visit: Payer: Self-pay | Admitting: Cardiology

## 2021-04-30 ENCOUNTER — Other Ambulatory Visit: Payer: Self-pay | Admitting: Internal Medicine

## 2021-04-30 DIAGNOSIS — Z1231 Encounter for screening mammogram for malignant neoplasm of breast: Secondary | ICD-10-CM

## 2021-05-31 ENCOUNTER — Ambulatory Visit
Admission: RE | Admit: 2021-05-31 | Discharge: 2021-05-31 | Disposition: A | Discharge status: Home / Self Care | Payer: Medicare Other | Source: Ambulatory Visit | Attending: Internal Medicine | Admitting: Internal Medicine

## 2021-05-31 DIAGNOSIS — Z1231 Encounter for screening mammogram for malignant neoplasm of breast: Secondary | ICD-10-CM

## 2022-01-08 DIAGNOSIS — N289 Disorder of kidney and ureter, unspecified: Secondary | ICD-10-CM

## 2022-01-08 HISTORY — DX: Disorder of kidney and ureter, unspecified: N28.9

## 2022-03-22 ENCOUNTER — Encounter (INDEPENDENT_AMBULATORY_CARE_PROVIDER_SITE_OTHER): Payer: Medicare Other | Admitting: Ophthalmology

## 2022-03-29 ENCOUNTER — Encounter (INDEPENDENT_AMBULATORY_CARE_PROVIDER_SITE_OTHER): Payer: Medicare Other | Admitting: Ophthalmology

## 2022-03-29 DIAGNOSIS — E113513 Type 2 diabetes mellitus with proliferative diabetic retinopathy with macular edema, bilateral: Secondary | ICD-10-CM

## 2022-03-29 DIAGNOSIS — I10 Essential (primary) hypertension: Secondary | ICD-10-CM

## 2022-03-29 DIAGNOSIS — H35033 Hypertensive retinopathy, bilateral: Secondary | ICD-10-CM | POA: Diagnosis not present

## 2022-03-29 DIAGNOSIS — H43813 Vitreous degeneration, bilateral: Secondary | ICD-10-CM | POA: Diagnosis not present

## 2022-04-26 ENCOUNTER — Encounter (INDEPENDENT_AMBULATORY_CARE_PROVIDER_SITE_OTHER): Payer: Medicare Other | Admitting: Ophthalmology

## 2022-04-26 DIAGNOSIS — I10 Essential (primary) hypertension: Secondary | ICD-10-CM | POA: Diagnosis not present

## 2022-04-26 DIAGNOSIS — E113513 Type 2 diabetes mellitus with proliferative diabetic retinopathy with macular edema, bilateral: Secondary | ICD-10-CM | POA: Diagnosis not present

## 2022-04-26 DIAGNOSIS — H35033 Hypertensive retinopathy, bilateral: Secondary | ICD-10-CM

## 2022-04-26 DIAGNOSIS — H43813 Vitreous degeneration, bilateral: Secondary | ICD-10-CM

## 2022-05-19 ENCOUNTER — Other Ambulatory Visit: Payer: Self-pay | Admitting: Internal Medicine

## 2022-05-19 DIAGNOSIS — Z1231 Encounter for screening mammogram for malignant neoplasm of breast: Secondary | ICD-10-CM

## 2022-05-25 ENCOUNTER — Encounter (INDEPENDENT_AMBULATORY_CARE_PROVIDER_SITE_OTHER): Payer: Medicare Other | Admitting: Ophthalmology

## 2022-05-25 DIAGNOSIS — H35033 Hypertensive retinopathy, bilateral: Secondary | ICD-10-CM

## 2022-05-25 DIAGNOSIS — I10 Essential (primary) hypertension: Secondary | ICD-10-CM | POA: Diagnosis not present

## 2022-05-25 DIAGNOSIS — E113513 Type 2 diabetes mellitus with proliferative diabetic retinopathy with macular edema, bilateral: Secondary | ICD-10-CM

## 2022-05-25 DIAGNOSIS — H43813 Vitreous degeneration, bilateral: Secondary | ICD-10-CM

## 2022-06-21 ENCOUNTER — Ambulatory Visit: Payer: Medicare Other

## 2022-06-22 ENCOUNTER — Encounter (INDEPENDENT_AMBULATORY_CARE_PROVIDER_SITE_OTHER): Payer: Medicare Other | Admitting: Ophthalmology

## 2022-06-22 DIAGNOSIS — I10 Essential (primary) hypertension: Secondary | ICD-10-CM

## 2022-06-22 DIAGNOSIS — E113513 Type 2 diabetes mellitus with proliferative diabetic retinopathy with macular edema, bilateral: Secondary | ICD-10-CM | POA: Diagnosis not present

## 2022-06-22 DIAGNOSIS — H43813 Vitreous degeneration, bilateral: Secondary | ICD-10-CM | POA: Diagnosis not present

## 2022-06-22 DIAGNOSIS — H35033 Hypertensive retinopathy, bilateral: Secondary | ICD-10-CM | POA: Diagnosis not present

## 2022-07-20 ENCOUNTER — Encounter (INDEPENDENT_AMBULATORY_CARE_PROVIDER_SITE_OTHER): Payer: Medicare Other | Admitting: Ophthalmology

## 2022-07-25 ENCOUNTER — Encounter (INDEPENDENT_AMBULATORY_CARE_PROVIDER_SITE_OTHER): Payer: Medicare Other | Admitting: Ophthalmology

## 2022-07-26 ENCOUNTER — Encounter (INDEPENDENT_AMBULATORY_CARE_PROVIDER_SITE_OTHER): Payer: Medicare Other | Admitting: Ophthalmology

## 2022-07-26 DIAGNOSIS — E113513 Type 2 diabetes mellitus with proliferative diabetic retinopathy with macular edema, bilateral: Secondary | ICD-10-CM | POA: Diagnosis not present

## 2022-07-26 DIAGNOSIS — H43813 Vitreous degeneration, bilateral: Secondary | ICD-10-CM

## 2022-07-26 DIAGNOSIS — H35033 Hypertensive retinopathy, bilateral: Secondary | ICD-10-CM

## 2022-07-26 DIAGNOSIS — I10 Essential (primary) hypertension: Secondary | ICD-10-CM | POA: Diagnosis not present

## 2022-08-16 ENCOUNTER — Ambulatory Visit
Admission: RE | Admit: 2022-08-16 | Discharge: 2022-08-16 | Disposition: A | Discharge status: Home / Self Care | Payer: Medicare Other | Source: Ambulatory Visit | Attending: Internal Medicine | Admitting: Internal Medicine

## 2022-08-16 DIAGNOSIS — Z1231 Encounter for screening mammogram for malignant neoplasm of breast: Secondary | ICD-10-CM

## 2022-08-23 ENCOUNTER — Encounter (INDEPENDENT_AMBULATORY_CARE_PROVIDER_SITE_OTHER): Payer: Medicare Other | Admitting: Ophthalmology

## 2022-08-23 DIAGNOSIS — I10 Essential (primary) hypertension: Secondary | ICD-10-CM

## 2022-08-23 DIAGNOSIS — H43813 Vitreous degeneration, bilateral: Secondary | ICD-10-CM

## 2022-08-23 DIAGNOSIS — E113513 Type 2 diabetes mellitus with proliferative diabetic retinopathy with macular edema, bilateral: Secondary | ICD-10-CM | POA: Diagnosis not present

## 2022-08-23 DIAGNOSIS — H35033 Hypertensive retinopathy, bilateral: Secondary | ICD-10-CM | POA: Diagnosis not present

## 2022-09-20 ENCOUNTER — Encounter (INDEPENDENT_AMBULATORY_CARE_PROVIDER_SITE_OTHER): Payer: Medicare Other | Admitting: Ophthalmology

## 2022-09-20 DIAGNOSIS — I10 Essential (primary) hypertension: Secondary | ICD-10-CM | POA: Diagnosis not present

## 2022-09-20 DIAGNOSIS — E113513 Type 2 diabetes mellitus with proliferative diabetic retinopathy with macular edema, bilateral: Secondary | ICD-10-CM

## 2022-09-20 DIAGNOSIS — H35033 Hypertensive retinopathy, bilateral: Secondary | ICD-10-CM

## 2022-09-20 DIAGNOSIS — H43813 Vitreous degeneration, bilateral: Secondary | ICD-10-CM | POA: Diagnosis not present

## 2022-10-25 ENCOUNTER — Encounter (INDEPENDENT_AMBULATORY_CARE_PROVIDER_SITE_OTHER): Payer: Medicare Other | Admitting: Ophthalmology

## 2022-10-25 DIAGNOSIS — H35033 Hypertensive retinopathy, bilateral: Secondary | ICD-10-CM | POA: Diagnosis not present

## 2022-10-25 DIAGNOSIS — I10 Essential (primary) hypertension: Secondary | ICD-10-CM

## 2022-10-25 DIAGNOSIS — H43813 Vitreous degeneration, bilateral: Secondary | ICD-10-CM

## 2022-10-25 DIAGNOSIS — E113513 Type 2 diabetes mellitus with proliferative diabetic retinopathy with macular edema, bilateral: Secondary | ICD-10-CM

## 2022-11-22 ENCOUNTER — Encounter (INDEPENDENT_AMBULATORY_CARE_PROVIDER_SITE_OTHER): Payer: Medicare Other | Admitting: Ophthalmology

## 2022-11-22 DIAGNOSIS — I10 Essential (primary) hypertension: Secondary | ICD-10-CM

## 2022-11-22 DIAGNOSIS — H43813 Vitreous degeneration, bilateral: Secondary | ICD-10-CM | POA: Diagnosis not present

## 2022-11-22 DIAGNOSIS — H35033 Hypertensive retinopathy, bilateral: Secondary | ICD-10-CM | POA: Diagnosis not present

## 2022-11-22 DIAGNOSIS — E113513 Type 2 diabetes mellitus with proliferative diabetic retinopathy with macular edema, bilateral: Secondary | ICD-10-CM

## 2022-11-22 DIAGNOSIS — H2513 Age-related nuclear cataract, bilateral: Secondary | ICD-10-CM

## 2022-11-22 DIAGNOSIS — Z7984 Long term (current) use of oral hypoglycemic drugs: Secondary | ICD-10-CM

## 2022-12-15 ENCOUNTER — Ambulatory Visit: Payer: Medicare Other | Admitting: Cardiology

## 2022-12-15 ENCOUNTER — Encounter: Payer: Self-pay | Admitting: Cardiology

## 2022-12-15 VITALS — BP 144/73 | HR 74 | Ht 65.0 in | Wt 208.0 lb

## 2022-12-15 DIAGNOSIS — I493 Ventricular premature depolarization: Secondary | ICD-10-CM

## 2022-12-15 DIAGNOSIS — E119 Type 2 diabetes mellitus without complications: Secondary | ICD-10-CM

## 2022-12-15 DIAGNOSIS — R0602 Shortness of breath: Secondary | ICD-10-CM

## 2022-12-15 DIAGNOSIS — I10 Essential (primary) hypertension: Secondary | ICD-10-CM

## 2022-12-15 DIAGNOSIS — R55 Syncope and collapse: Secondary | ICD-10-CM

## 2022-12-15 NOTE — Progress Notes (Signed)
ID:  Michaela Kim, DOB December 12, 1960, MRN [id number]  PCP:  Ralene Ok, MD  Cardiologist:  Tessa Lerner, DO, [facility] (established care 12/15/22) Former Cardiology Providers: Dr. Yates Decamp  REASON FOR CONSULT: Abnormal EKG   REQUESTING PHYSICIAN:  Ralene Ok, MD [address] [place],  [place] [address]  Chief Complaint  Patient presents with   Abnormal ECG   New Patient (Initial Visit)    HPI  Michaela Kim is a 62 y.o. African-American female who presents to the clinic for evaluation of abnormal EKG at the request of Ralene Ok, MD. Her past medical history and cardiovascular risk factors include: HLD, Diabetes mellitus type 2, history of diabetic retinopathy, hypertension, former smoker  Patient is referred to the practice for evaluation of abnormal EKG/PVCs.  During the past Mother's Day patient had gone to her mother's grave site along with other family members at that time she had a sudden onset of shortness of breath and felt like she was going to pass out.  She walked to the car turned on the air and drank some water and her symptoms improved.  She did not feel well the following days unable to elaborate upon the details and followed up with PCP.  She was noted to have sinus rhythm on EKG with premature ventricular contractions.  Given her symptoms, EKG findings, and multiple cardiovascular risk factors is referred to cardiology for further evaluation and management.  Currently she is at baseline.  Denies anginal chest pain or heart failure symptoms.  Review of systems are positive for syncope back in September 2023.  Patient [place] of one of the rides she felt thirsty and went to go grab water.  On her way back she had to sit down and for couple minutes lost consciousness.  EMS was called but patient did not go to the ER.  She has not had any recurrence of syncope.   FUNCTIONAL STATUS: No structured exercise  program or daily routine.   CARDIAC DATABASE: EKG: December 15, 2022: Normal sinus rhythm, 69 bpm, without underlying ischemia or injury pattern.  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  Heart Catheterization: None  ALLERGIES: No Known Allergies  MEDICATION LIST PRIOR TO VISIT: Current Meds  Medication Sig   acetaminophen (TYLENOL) 325 MG tablet Take 325-650 mg by mouth every 6 (six) hours as needed for mild pain.    amLODipine (NORVASC) 10 MG tablet Take 10 mg by mouth daily.   cholecalciferol (VITAMIN D3) 25 MCG (1000 UNIT) tablet Take 1,000 Units by mouth daily.   dapagliflozin propanediol (FARXIGA) 10 MG TABS tablet Take by mouth daily.   diphenhydrAMINE (BENADRYL) 25 mg capsule Take 25 mg by mouth every 6 (six) hours as needed.   ferrous sulfate 325 (65 FE) MG EC tablet Take 325 mg by mouth daily with breakfast.   lisinopril-hydrochlorothiazide (PRINZIDE,ZESTORETIC) 20-12.5 MG tablet Take 2 tablets by mouth daily.   metFORMIN (GLUCOPHAGE) 1000 MG tablet Take 1,000 mg by mouth 2 (two) times daily.   metoprolol tartrate (LOPRESSOR) 25 MG tablet TAKE 1 TABLET(25 MG) BY MOUTH TWICE DAILY. FOLLOW UP APPOINTMENT FOR FURTHER REFILLS   montelukast (SINGULAIR) 10 MG tablet Take 10 mg by  mouth at bedtime.   Multiple Vitamins-Minerals (SENTRY SENIOR PO) Take 1 tablet by mouth daily.   pravastatin (PRAVACHOL) 20 MG tablet Take 20 mg by mouth daily.   prednisoLONE acetate (PRED FORTE) 1 % ophthalmic suspension Place 1 drop into the right eye 4 (four) times daily.   pyridOXINE (VITAMIN B-6) 100 MG tablet Take 100 mg by mouth daily.   Vitamin A 2400 MCG (8000 UT) TABS Take 1 tablet by mouth daily at 2 PM.   vitamin B-12  (CYANOCOBALAMIN) 500 MCG tablet Take 500 mcg by mouth daily.     PAST MEDICAL HISTORY: Past Medical History:  Diagnosis Date   Diabetes mellitus without complication (HCC)    Hyperlipidemia    Hypertension    Kidney disease 01/08/2022    PAST SURGICAL HISTORY: Past Surgical History:  Procedure Laterality Date   ENTEROSCOPY N/A 02/23/2018   Procedure: ENTEROSCOPY;  Surgeon: Jeani Hawking, MD;  Location: [facility] ENDOSCOPY;  Service: Endoscopy;  Laterality: N/A;   GIVENS CAPSULE STUDY N/A 01/22/2018   Procedure: GIVENS CAPSULE STUDY;  Surgeon: Jeani Hawking, MD;  Location: [facility] ENDOSCOPY;  Service: Endoscopy;  Laterality: N/A;   HOT HEMOSTASIS N/A 02/23/2018   Procedure: HOT HEMOSTASIS (ARGON PLASMA COAGULATION/BICAP);  Surgeon: Jeani Hawking, MD;  Location: [facility];  Service: Endoscopy;  Laterality: N/A;    FAMILY HISTORY: The patient family history includes Cancer in her father; Diabetes in her brother, sister, and sister; Heart disease in her mother; Hypertension in her mother.  SOCIAL HISTORY:  The patient  reports that she has quit smoking. She has never used smokeless tobacco. She reports that she does not drink alcohol and does not use drugs.  REVIEW OF SYSTEMS: Review of Systems  Cardiovascular:  Positive for palpitations. Negative for chest pain, claudication, dyspnea on exertion, irregular heartbeat, leg swelling, near-syncope, orthopnea, paroxysmal nocturnal dyspnea and syncope.  Respiratory:  Positive for shortness of breath.   Hematologic/Lymphatic: Negative for bleeding problem.  Musculoskeletal:  Positive for neck pain. Negative for muscle cramps and myalgias.  Neurological:  Negative for dizziness and light-headedness.    PHYSICAL EXAM:    12/15/2022   11:16 AM 02/23/2018   11:00 AM 02/23/2018   10:53 AM  Vitals with BMI  Height 5\' 5"     Weight 208 lbs    BMI 34.61    Systolic 144 116 191  Diastolic 73 71 54  Pulse 74 61 71    Physical Exam  Constitutional:  No distress.  Age appropriate, hemodynamically stable.   Neck: No JVD present.  Cardiovascular: Normal rate, regular rhythm, S1 normal, S2 normal, intact distal pulses and normal pulses. Occasional extrasystoles are present. Exam reveals no gallop, no S3 and no S4.  No murmur heard. Pulses:      Dorsalis pedis pulses are 2+ on the right side and 2+ on the left side.       Posterior tibial pulses are 2+ on the right side and 2+ on the left side.  Pulmonary/Chest: Effort normal and breath sounds normal. No stridor. She has no wheezes. She has no rales.  Abdominal: Soft. Bowel sounds are normal. She exhibits no distension. There is no abdominal tenderness.  Musculoskeletal:        General: No edema.     Cervical back: Neck supple.  Neurological: She is alert and oriented to person, place, and time. She has intact cranial nerves (2-12).  Skin: Skin is warm and moist.     LABORATORY DATA:    Latest Ref  Rng & Units 07/14/2014   11:49 AM  CBC  WBC 4.0 - 10.5 K/uL 4.5   Hemoglobin 12.0 - 15.0 g/dL 16.1   Hematocrit 09.6 - 46.0 % 34.5   Platelets 150 - 400 K/uL 225        Latest Ref Rng & Units 07/14/2014   11:49 AM  CMP  Glucose 70 - 99 mg/dL 045   BUN 6 - 23 mg/dL 7   Creatinine 4.09 - 8.11 mg/dL 9.14   Sodium 782 - 956 mmol/L 133   Potassium 3.5 - 5.1 mmol/L 4.4   Chloride 96 - 112 mEq/L 101   CO2 19 - 32 mmol/L 22   Calcium 8.4 - 10.5 mg/dL 9.2     No results found for: "CHOL", "HDL", "LDLCALC", "LDLDIRECT", "TRIG", "CHOLHDL" No components found for: "NTPROBNP" No results for input(s): "PROBNP" in the last 8760 hours. No results for input(s): "TSH" in the last 8760 hours.  BMP No results for input(s): "NA", "K", "CL", "CO2", "GLUCOSE", "BUN", "CREATININE", "CALCIUM", "GFRNONAA", "GFRAA" in the last 8760 hours.  HEMOGLOBIN A1C No results found for: "HGBA1C", "MPG"  External Labs: Collected: Nov 12, 2022. Hemoglobin 10.7, hematocrit 39.4%. BUN 29, creatinine 1.4. eGFR  43. Sodium 138, potassium 4.4, chloride 100, bicarb 17. Troponin T 11 (within normal limits) AST 16, ALT 21, alkaline phosphatase 61   IMPRESSION:    ICD-10-CM   1. Shortness of breath  R06.02 EKG 12-Lead    [facility] ECHOCARDIOGRAM COMPLETE    CT CARDIAC SCORING (SELF PAY ONLY)    LONG TERM MONITOR (3-14 DAYS)    2. PVC's (premature ventricular contractions)  I49.3 LONG TERM MONITOR (3-14 DAYS)    3. Near syncope  R55     4. Benign hypertension  I10     5. Non-insulin dependent type 2 diabetes mellitus (HCC)  E11.9 CT CARDIAC SCORING (SELF PAY ONLY)       RECOMMENDATIONS: Judeth Prisbrey is a 62 y.o. African-American female whose past medical history and cardiac risk factors include: HLD, Diabetes mellitus type 2, history of diabetic retinopathy, hypertension, former smoker.  Shortness of breath Multifactorial, no unifying diagnosis. Educated her on the importance of better blood pressure control. Echo will be ordered to evaluate for structural heart disease and left ventricular systolic function. Cardiac monitor to evaluate for PVCs and/or other dysrhythmias. Given her risk factors and symptoms recommend coronary calcium score for further risk stratification  PVC's (premature ventricular contractions) Currently on metoprolol. EKG today shows sinus rhythm without ectopy.  However, EKG provided by PCP notes occasional PVCs. She is noted to have history of PVCs dating back to 2019. May consider change in pharmacological therapy based on cardiac monitor results.  Near syncope Likely secondary to dehydration or emotional stress. Educated her on the importance of keeping herself well-hydrated, consuming 3 balanced heart healthy meals. Cardiac monitor as discussed above. Echocardiogram to evaluate for structural heart disease.  Benign hypertension Office blood pressures are currently not at goal. Medications reconciled. Currently managed by PCP  Data Reviewed: I have  independently reviewed external notes provided by the referring provider as part of this office visit.   I have independently reviewed results of 2019 echo and stress test, EKG, labs provided by PCP as part of medical decision making. I have ordered the following tests:  Orders Placed This Encounter  Procedures   CT CARDIAC SCORING (SELF PAY ONLY)    Standing Status:   Future    Standing Expiration Date:   12/15/2023  Order Specific Question:   Preferred imaging location?    Answer:   External    Order Specific Question:   Is patient pregnant?    Answer:   No   LONG TERM MONITOR (3-14 DAYS)    Standing Status:   Future    Number of Occurrences:   1    Order Specific Question:   Where should this test be performed?    Answer:   [facility]    Order Specific Question:   Does the patient have an implanted cardiac device?    Answer:   No    Order Specific Question:   Prescribed days of wear    Answer:   7    Order Specific Question:   Type of enrollment    Answer:   Clinic Enrollment   EKG 12-Lead   PCV ECHOCARDIOGRAM COMPLETE    Standing Status:   Future    Standing Expiration Date:   12/15/2023   I have not made medications changes at today's encounter as noted above.  FINAL MEDICATION LIST END OF ENCOUNTER: No orders of the defined types were placed in this encounter.   Medications Discontinued During This Encounter  Medication Reason   Iron-FA-B Cmp-C-Biot-Probiotic (FUSION PLUS) CAPS    Cholecalciferol (VITAMIN D3) 2000 units TABS    amLODipine (NORVASC) 5 MG tablet      Current Outpatient Medications:    acetaminophen (TYLENOL) 325 MG tablet, Take 325-650 mg by mouth every 6 (six) hours as needed for mild pain. , Disp: , Rfl:    amLODipine (NORVASC) 10 MG tablet, Take 10 mg by mouth daily., Disp: , Rfl:    cholecalciferol (VITAMIN D3) 25 MCG (1000 UNIT) tablet, Take 1,000 Units by mouth daily., Disp: , Rfl:    dapagliflozin propanediol (FARXIGA) 10 MG TABS tablet,  Take by mouth daily., Disp: , Rfl:    diphenhydrAMINE (BENADRYL) 25 mg capsule, Take 25 mg by mouth every 6 (six) hours as needed., Disp: , Rfl:    ferrous sulfate 325 (65 FE) MG EC tablet, Take 325 mg by mouth daily with breakfast., Disp: , Rfl:    lisinopril-hydrochlorothiazide (PRINZIDE,ZESTORETIC) 20-12.5 MG tablet, Take 2 tablets by mouth daily., Disp: , Rfl: 5   metFORMIN (GLUCOPHAGE) 1000 MG tablet, Take 1,000 mg by mouth 2 (two) times daily., Disp: , Rfl: 6   metoprolol tartrate (LOPRESSOR) 25 MG tablet, TAKE 1 TABLET(25 MG) BY MOUTH TWICE DAILY. FOLLOW UP APPOINTMENT FOR FURTHER REFILLS, Disp: 180 tablet, Rfl: 1   montelukast (SINGULAIR) 10 MG tablet, Take 10 mg by mouth at bedtime., Disp: , Rfl:    Multiple Vitamins-Minerals (SENTRY SENIOR PO), Take 1 tablet by mouth daily., Disp: , Rfl:    pravastatin (PRAVACHOL) 20 MG tablet, Take 20 mg by mouth daily., Disp: , Rfl:    prednisoLONE acetate (PRED FORTE) 1 % ophthalmic suspension, Place 1 drop into the right eye 4 (four) times daily., Disp: , Rfl:    pyridOXINE (VITAMIN B-6) 100 MG tablet, Take 100 mg by mouth daily., Disp: , Rfl:    Vitamin A 2400 MCG (8000 UT) TABS, Take 1 tablet by mouth daily at 2 PM., Disp: , Rfl:    vitamin B-12 (CYANOCOBALAMIN) 500 MCG tablet, Take 500 mcg by mouth daily., Disp: , Rfl:   Orders Placed This Encounter  Procedures   CT CARDIAC SCORING (SELF PAY ONLY)   LONG TERM MONITOR (3-14 DAYS)   EKG 12-Lead   PCV ECHOCARDIOGRAM COMPLETE    There are  no Patient Instructions on file for this visit.   --Continue cardiac medications as reconciled in final medication list. --Return in about 8 weeks (around 02/09/2023) for Follow up PCVs, Review test results. or sooner if needed. --Continue follow-up with your primary care physician regarding the management of your other chronic comorbid conditions.  Patient's questions and concerns were addressed to her satisfaction. She voices understanding of the  instructions provided during this encounter.   This note was created using a voice recognition software as a result there may be grammatical errors inadvertently enclosed that do not reflect the nature of this encounter. Every attempt is made to correct such errors.  Tessa Lerner, [place], [facility]  Pager:  [phone] Office: [phone]

## 2022-12-20 ENCOUNTER — Encounter (INDEPENDENT_AMBULATORY_CARE_PROVIDER_SITE_OTHER): Payer: Medicare Other | Admitting: Ophthalmology

## 2022-12-20 DIAGNOSIS — H35033 Hypertensive retinopathy, bilateral: Secondary | ICD-10-CM

## 2022-12-20 DIAGNOSIS — I10 Essential (primary) hypertension: Secondary | ICD-10-CM | POA: Diagnosis not present

## 2022-12-20 DIAGNOSIS — E113513 Type 2 diabetes mellitus with proliferative diabetic retinopathy with macular edema, bilateral: Secondary | ICD-10-CM

## 2022-12-20 DIAGNOSIS — Z7984 Long term (current) use of oral hypoglycemic drugs: Secondary | ICD-10-CM

## 2022-12-20 DIAGNOSIS — H43813 Vitreous degeneration, bilateral: Secondary | ICD-10-CM

## 2023-01-09 ENCOUNTER — Ambulatory Visit: Payer: Medicare Other

## 2023-01-09 DIAGNOSIS — R0602 Shortness of breath: Secondary | ICD-10-CM

## 2023-01-09 DIAGNOSIS — I493 Ventricular premature depolarization: Secondary | ICD-10-CM

## 2023-01-17 ENCOUNTER — Encounter (INDEPENDENT_AMBULATORY_CARE_PROVIDER_SITE_OTHER): Payer: Medicare Other | Admitting: Ophthalmology

## 2023-01-17 DIAGNOSIS — H35033 Hypertensive retinopathy, bilateral: Secondary | ICD-10-CM

## 2023-01-17 DIAGNOSIS — E113513 Type 2 diabetes mellitus with proliferative diabetic retinopathy with macular edema, bilateral: Secondary | ICD-10-CM | POA: Diagnosis not present

## 2023-01-17 DIAGNOSIS — H43813 Vitreous degeneration, bilateral: Secondary | ICD-10-CM | POA: Diagnosis not present

## 2023-01-17 DIAGNOSIS — I10 Essential (primary) hypertension: Secondary | ICD-10-CM | POA: Diagnosis not present

## 2023-01-17 DIAGNOSIS — Z7984 Long term (current) use of oral hypoglycemic drugs: Secondary | ICD-10-CM

## 2023-01-18 NOTE — Progress Notes (Signed)
Called and spoke with patient regarding her echocardiogram results.

## 2023-01-23 ENCOUNTER — Ambulatory Visit (HOSPITAL_COMMUNITY)
Admission: RE | Admit: 2023-01-23 | Discharge: 2023-01-23 | Disposition: A | Discharge status: Home / Self Care | Payer: Medicare Other | Source: Ambulatory Visit | Attending: Cardiology | Admitting: Cardiology

## 2023-01-23 DIAGNOSIS — E119 Type 2 diabetes mellitus without complications: Secondary | ICD-10-CM | POA: Insufficient documentation

## 2023-01-23 DIAGNOSIS — R0602 Shortness of breath: Secondary | ICD-10-CM | POA: Insufficient documentation

## 2023-02-09 ENCOUNTER — Ambulatory Visit: Payer: Medicare Other | Admitting: Cardiology

## 2023-02-14 ENCOUNTER — Encounter (INDEPENDENT_AMBULATORY_CARE_PROVIDER_SITE_OTHER): Payer: Medicare Other | Admitting: Ophthalmology

## 2023-02-14 DIAGNOSIS — Z7984 Long term (current) use of oral hypoglycemic drugs: Secondary | ICD-10-CM

## 2023-02-14 DIAGNOSIS — I10 Essential (primary) hypertension: Secondary | ICD-10-CM

## 2023-02-14 DIAGNOSIS — H35033 Hypertensive retinopathy, bilateral: Secondary | ICD-10-CM | POA: Diagnosis not present

## 2023-02-14 DIAGNOSIS — E113513 Type 2 diabetes mellitus with proliferative diabetic retinopathy with macular edema, bilateral: Secondary | ICD-10-CM | POA: Diagnosis not present

## 2023-02-14 DIAGNOSIS — H43813 Vitreous degeneration, bilateral: Secondary | ICD-10-CM | POA: Diagnosis not present

## 2023-02-16 NOTE — Progress Notes (Signed)
Called and spoke to patient she voiced understanding

## 2023-02-20 ENCOUNTER — Encounter: Payer: Self-pay | Admitting: Cardiology

## 2023-02-20 ENCOUNTER — Ambulatory Visit: Payer: Medicare Other | Admitting: Cardiology

## 2023-02-20 VITALS — BP 128/66 | HR 55 | Resp 16 | Ht 65.0 in | Wt 211.0 lb

## 2023-02-20 DIAGNOSIS — R55 Syncope and collapse: Secondary | ICD-10-CM

## 2023-02-20 DIAGNOSIS — I2510 Atherosclerotic heart disease of native coronary artery without angina pectoris: Secondary | ICD-10-CM

## 2023-02-20 DIAGNOSIS — I10 Essential (primary) hypertension: Secondary | ICD-10-CM

## 2023-02-20 DIAGNOSIS — I493 Ventricular premature depolarization: Secondary | ICD-10-CM

## 2023-02-20 DIAGNOSIS — E119 Type 2 diabetes mellitus without complications: Secondary | ICD-10-CM

## 2023-02-20 DIAGNOSIS — R0602 Shortness of breath: Secondary | ICD-10-CM

## 2023-02-20 MED ORDER — ROSUVASTATIN CALCIUM 20 MG PO TABS
20.0000 mg | ORAL_TABLET | Freq: Every day | ORAL | 0 refills | Status: DC
Start: 2023-02-20 — End: 2023-05-19

## 2023-02-20 NOTE — Progress Notes (Signed)
ID:  Michaela Kim, DOB [birth year], MRN [id number]  PCP:  Ralene Ok, MD  Cardiologist:  Tessa Lerner, DO, [facility] (established care 12/15/22) Former Cardiology Providers: Dr. Yates Decamp  Date: 02/20/23 Last Office Visit: 12/15/2022  Chief Complaint  Patient presents with   Follow-up    PVCs Reviewed test results    HPI  Michaela Kim is a 62 y.o. African-American female who presents to the clinic for evaluation of abnormal EKG at the request of Ralene Ok, MD. Her past medical history and cardiovascular risk factors include: HLD, Diabetes mellitus type 2, history of diabetic retinopathy, hypertension, former smoker  Patient was referred to the practice given her shortness of breath, PVCs on EKG, and multiple cardiovascular risk factors.  Since last office visit patient has undergone echocardiogram, coronary calcium score, and a Zio patch.  Shortness of breath/multiple cardiovascular risk factors: Patient underwent coronary calcium score and was noted to have a CAC of 1035 placing her at the 99th percentile.  She denies chest pain at rest or with related activities but she continues to have shortness of breath.  Overall intensity frequency and duration has not worsened.  PVCs: Zio patch since last office visit notes an average heart rate of 67 bpm, and a total PVC burden of approximately 6.2%.  She is currently on Lopressor and tolerating the medication well without any side effects or intolerances.  Echocardiogram notes preserved LVEF without any significant valvular heart disease.   FUNCTIONAL STATUS: No structured exercise program or daily routine.   CARDIAC DATABASE: EKG: December 15, 2022: Normal sinus rhythm, 69 bpm, without underlying ischemia or injury pattern.  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 Normal left  ventricular wall thickness. Normal global wall motion. Normal LV systolic function with EF 62%. Normal diastolic filling pattern.  Structurally normal trileaflet aortic valve. Mild (Grade I) aortic regurgitation. Mild tricuspid regurgitation. Estimated pulmonary artery systolic pressure 31 mmHg.   Stress Testing: Treadmill stress test Nov 17, 2017: Exercise time 4 minutes 59 seconds, achieved 6.17 METS, 89% of maximum age-predicted heart rate, stress ECG negative for ischemia.  Heart Catheterization: None  CT Cardiac Scoring: January 23, 2023 Left main 0. LAD 691 LCx 11.8  RCA 332 Coronary calcium score of 1035. This was 99th percentile for age-, race-, and sex-matched controls Aortic atherosclerosis Noncardiac findings: No significant extracardiac findings within the visualized chest.  Cardiac monitor (Zio Patch): January 09, 2023 -January 16, 2023 Dominant rhythm sinus. Heart rate 45-125 bpm. Avg HR 67 bpm. No atrial fibrillation, supraventricular tachycardia, ventricular tachycardia, high grade AV block, pauses (3 seconds or longer). Total ventricular ectopic burden 6.2% (predominantly of isolated beats). Total supraventricular ectopic burden <1%. Patient triggered events: 3. Sinus rhythm with PVCs.    ALLERGIES: No Known Allergies  MEDICATION LIST PRIOR TO VISIT: Current Meds  Medication Sig   acetaminophen (TYLENOL) 325 MG tablet Take 325-650 mg by mouth every 6 (six) hours as needed for mild pain.    amLODipine (NORVASC) 10 MG tablet Take 10 mg by mouth daily.   cholecalciferol (VITAMIN D3) 25 MCG (1000 UNIT) tablet Take 1,000 Units by mouth daily.   ciprofloxacin (CILOXAN) 0.3 % ophthalmic solution Place 1 drop into both eyes every 2 (two) hours.   dapagliflozin propanediol (FARXIGA) 10 MG TABS tablet Take by mouth daily.   diphenhydrAMINE (BENADRYL) 25 mg capsule Take 25 mg  by mouth every 6 (six) hours as needed.   ferrous sulfate 325 (65 FE) MG EC tablet Take 325 mg by mouth  daily with breakfast.   lisinopril-hydrochlorothiazide (PRINZIDE,ZESTORETIC) 20-12.5 MG tablet Take 2 tablets by mouth daily.   metFORMIN (GLUCOPHAGE) 1000 MG tablet Take 1,000 mg by mouth 2 (two) times daily.   metoprolol tartrate (LOPRESSOR) 25 MG tablet TAKE 1 TABLET(25 MG) BY MOUTH TWICE DAILY. FOLLOW UP APPOINTMENT FOR FURTHER REFILLS   montelukast (SINGULAIR) 10 MG tablet Take 10 mg by mouth at bedtime.   pyridOXINE (VITAMIN B-6) 100 MG tablet Take 100 mg by mouth daily.   rosuvastatin (CRESTOR) 20 MG tablet Take 1 tablet (20 mg total) by mouth at bedtime.   Vitamin A 2400 MCG (8000 UT) TABS Take 1 tablet by mouth daily at 2 PM.   vitamin B-12 (CYANOCOBALAMIN) 500 MCG tablet Take 500 mcg by mouth daily.   [DISCONTINUED] pravastatin (PRAVACHOL) 20 MG tablet Take 20 mg by mouth daily.     PAST MEDICAL HISTORY: Past Medical History:  Diagnosis Date   Diabetes mellitus without complication (HCC)    Hyperlipidemia    Hypertension    Kidney disease 01/08/2022    PAST SURGICAL HISTORY: Past Surgical History:  Procedure Laterality Date   ENTEROSCOPY N/A 02/23/2018   Procedure: ENTEROSCOPY;  Surgeon: Jeani Hawking, MD;  Location: [facility] ENDOSCOPY;  Service: Endoscopy;  Laterality: N/A;   GIVENS CAPSULE STUDY N/A 01/22/2018   Procedure: GIVENS CAPSULE STUDY;  Surgeon: Jeani Hawking, MD;  Location: [facility] ENDOSCOPY;  Service: Endoscopy;  Laterality: N/A;   HOT HEMOSTASIS N/A 02/23/2018   Procedure: HOT HEMOSTASIS (ARGON PLASMA COAGULATION/BICAP);  Surgeon: Jeani Hawking, MD;  Location: [facility] ENDOSCOPY;  Service: Endoscopy;  Laterality: N/A;    FAMILY HISTORY: The patient family history includes Cancer in her father; Diabetes in her brother, sister, and sister; Heart disease in her mother; Hypertension in her mother.  SOCIAL HISTORY:  The patient  reports that she has quit smoking. She has never used smokeless tobacco. She reports that she does not drink alcohol and does not use drugs.  REVIEW OF  SYSTEMS: Review of Systems  Cardiovascular:  Positive for palpitations (stable). Negative for chest pain, claudication, dyspnea on exertion, irregular heartbeat, leg swelling, near-syncope, orthopnea, paroxysmal nocturnal dyspnea and syncope.  Respiratory:  Positive for shortness of breath (improved).   Hematologic/Lymphatic: Negative for bleeding problem.  Musculoskeletal:  Positive for neck pain. Negative for muscle cramps and myalgias.  Neurological:  Negative for dizziness and light-headedness.    PHYSICAL EXAM:    02/20/2023    1:55 PM 12/15/2022   11:16 AM 02/23/2018   11:00 AM  Vitals with BMI  Height 5\' 5"  5\' 5"    Weight 211 lbs 208 lbs   BMI 35.11 34.61   Systolic 128 144 161  Diastolic 66 73 71  Pulse 55 74 61    Physical Exam  Constitutional: No distress.  Age appropriate, hemodynamically stable.   Neck: No JVD present.  Cardiovascular: Normal rate, regular rhythm, S1 normal, S2 normal, intact distal pulses and normal pulses. Exam reveals no gallop, no S3 and no S4.  No murmur heard. Pulses:      Dorsalis pedis pulses are 2+ on the right side and 2+ on the left side.       Posterior tibial pulses are 2+ on the right side and 2+ on the left side.  Pulmonary/Chest: Effort normal and breath sounds normal. No stridor. She has no wheezes. She has no  rales.  Abdominal: Soft. Bowel sounds are normal. She exhibits no distension. There is no abdominal tenderness.  Musculoskeletal:        General: No edema.     Cervical back: Neck supple.  Neurological: She is alert and oriented to person, place, and time. She has intact cranial nerves (2-12).  Skin: Skin is warm and moist.     LABORATORY DATA:    Latest Ref Rng & Units 07/14/2014   11:49 AM  CBC  WBC 4.0 - 10.5 K/uL 4.5   Hemoglobin 12.0 - 15.0 g/dL 42.5   Hematocrit 95.6 - 46.0 % 34.5   Platelets 150 - 400 K/uL 225        Latest Ref Rng & Units 07/14/2014   11:49 AM  CMP  Glucose 70 - 99 mg/dL 387   BUN 6 - 23  mg/dL 7   Creatinine 5.64 - 3.32 mg/dL 9.51   Sodium 884 - 166 mmol/L 133   Potassium 3.5 - 5.1 mmol/L 4.4   Chloride 96 - 112 mEq/L 101   CO2 19 - 32 mmol/L 22   Calcium 8.4 - 10.5 mg/dL 9.2     No results found for: "CHOL", "HDL", "LDLCALC", "LDLDIRECT", "TRIG", "CHOLHDL" No components found for: "NTPROBNP" No results for input(s): "PROBNP" in the last 8760 hours. No results for input(s): "TSH" in the last 8760 hours.  BMP No results for input(s): "NA", "K", "CL", "CO2", "GLUCOSE", "BUN", "CREATININE", "CALCIUM", "GFRNONAA", "GFRAA" in the last 8760 hours.  HEMOGLOBIN A1C No results found for: "HGBA1C", "MPG"  External Labs: Collected: Nov 12, 2022. Hemoglobin 10.7, hematocrit 39.4%. BUN 29, creatinine 1.4. eGFR 43. Sodium 138, potassium 4.4, chloride 100, bicarb 17. Troponin T 11 (within normal limits) AST 16, ALT 21, alkaline phosphatase 61   IMPRESSION:    ICD-10-CM   1. Coronary atherosclerosis due to calcified coronary lesion  I25.10 rosuvastatin (CRESTOR) 20 MG tablet   I25.84 NM PET CT CARDIAC PERFUSION MULTI W/ABSOLUTE BLOODFLOW    2. Shortness of breath  R06.02 rosuvastatin (CRESTOR) 20 MG tablet    NM PET CT CARDIAC PERFUSION MULTI W/ABSOLUTE BLOODFLOW    3. PVC's (premature ventricular contractions)  I49.3 rosuvastatin (CRESTOR) 20 MG tablet    NM PET CT CARDIAC PERFUSION MULTI W/ABSOLUTE BLOODFLOW    4. Near syncope  R55     5. Benign hypertension  I10     6. Non-insulin dependent type 2 diabetes mellitus (HCC)  E11.9         RECOMMENDATIONS: Michaela Kim is a 62 y.o. African-American female whose past medical history and cardiac risk factors include: Severe coronary artery calcification (total CAC 1035, 99 percentile), aortic atherosclerosis, HLD, Diabetes mellitus type 2, history of diabetic retinopathy, hypertension, former smoker.  Coronary atherosclerosis due to calcified coronary lesion Shortness of breath Total CAC 1035, 99  percentile. Denies anginal chest pain or heart failure symptoms. But given her shortness of breath and multiple cardiovascular risk factors we will proceed with additional workup. Patient is unable to exercise due to osteoarthritis of the knee and given her severe CAC we will proceed with cardiac PET/CT to evaluate for reversible ischemia. Will hold off on initiation of aspirin 81 mg p.o. daily given her underlying anemia. Will discontinue pravastatin and start rosuvastatin 20 mg p.o. nightly  Informed Consent   Shared Decision Making/Informed Consent The risks [chest pain, shortness of breath, cardiac arrhythmias, dizziness, blood pressure fluctuations, myocardial infarction, stroke/transient ischemic attack, nausea, vomiting, allergic reaction, radiation exposure, metallic taste  sensation and life-threatening complications (estimated to be 1 in 10,000)], benefits (risk stratification, diagnosing coronary artery disease, treatment guidance) and alternatives of a cardiac PET stress test were discussed in detail with Michaela Kim and she agrees to proceed.    PVC's (premature ventricular contractions) History of PVCs dating back to 2019. Currently on metoprolol titrate twice daily. Most recent Zio patch notes a PVC burden of 6.2%. Continue to monitor for now.  Near syncope Recent history of near syncope likely secondary to dehydration and emotional stress. No reoccurrence.  Monitor for now.    Benign hypertension Office blood pressures are well-controlled. Continue current medical therapy.  FINAL MEDICATION LIST END OF ENCOUNTER: Meds ordered this encounter  Medications   rosuvastatin (CRESTOR) 20 MG tablet    Sig: Take 1 tablet (20 mg total) by mouth at bedtime.    Dispense:  90 tablet    Refill:  0    Medications Discontinued During This Encounter  Medication Reason   prednisoLONE acetate (PRED FORTE) 1 % ophthalmic suspension Change in therapy   pravastatin (PRAVACHOL) 20 MG  tablet Change in therapy     Current Outpatient Medications:    acetaminophen (TYLENOL) 325 MG tablet, Take 325-650 mg by mouth every 6 (six) hours as needed for mild pain. , Disp: , Rfl:    amLODipine (NORVASC) 10 MG tablet, Take 10 mg by mouth daily., Disp: , Rfl:    cholecalciferol (VITAMIN D3) 25 MCG (1000 UNIT) tablet, Take 1,000 Units by mouth daily., Disp: , Rfl:    ciprofloxacin (CILOXAN) 0.3 % ophthalmic solution, Place 1 drop into both eyes every 2 (two) hours., Disp: , Rfl:    dapagliflozin propanediol (FARXIGA) 10 MG TABS tablet, Take by mouth daily., Disp: , Rfl:    diphenhydrAMINE (BENADRYL) 25 mg capsule, Take 25 mg by mouth every 6 (six) hours as needed., Disp: , Rfl:    ferrous sulfate 325 (65 FE) MG EC tablet, Take 325 mg by mouth daily with breakfast., Disp: , Rfl:    lisinopril-hydrochlorothiazide (PRINZIDE,ZESTORETIC) 20-12.5 MG tablet, Take 2 tablets by mouth daily., Disp: , Rfl: 5   metFORMIN (GLUCOPHAGE) 1000 MG tablet, Take 1,000 mg by mouth 2 (two) times daily., Disp: , Rfl: 6   metoprolol tartrate (LOPRESSOR) 25 MG tablet, TAKE 1 TABLET(25 MG) BY MOUTH TWICE DAILY. FOLLOW UP APPOINTMENT FOR FURTHER REFILLS, Disp: 180 tablet, Rfl: 1   montelukast (SINGULAIR) 10 MG tablet, Take 10 mg by mouth at bedtime., Disp: , Rfl:    pyridOXINE (VITAMIN B-6) 100 MG tablet, Take 100 mg by mouth daily., Disp: , Rfl:    rosuvastatin (CRESTOR) 20 MG tablet, Take 1 tablet (20 mg total) by mouth at bedtime., Disp: 90 tablet, Rfl: 0   Vitamin A 2400 MCG (8000 UT) TABS, Take 1 tablet by mouth daily at 2 PM., Disp: , Rfl:    vitamin B-12 (CYANOCOBALAMIN) 500 MCG tablet, Take 500 mcg by mouth daily., Disp: , Rfl:    Multiple Vitamins-Minerals (SENTRY SENIOR PO), Take 1 tablet by mouth daily. (Patient not taking: Reported on 02/20/2023), Disp: , Rfl:   Orders Placed This Encounter  Procedures   NM PET CT CARDIAC PERFUSION MULTI W/ABSOLUTE BLOODFLOW    There are no Patient Instructions on  file for this visit.   --Continue cardiac medications as reconciled in final medication list. --Return in about 3 months (around 05/23/2023) for Follow up, Coronary artery calcification, Review test results, PVCs. or sooner if needed. --Continue follow-up with your primary care physician  regarding the management of your other chronic comorbid conditions.  Patient's questions and concerns were addressed to her satisfaction. She voices understanding of the instructions provided during this encounter.   This note was created using a voice recognition software as a result there may be grammatical errors inadvertently enclosed that do not reflect the nature of this encounter. Every attempt is made to correct such errors.  Tessa Lerner, [place], [place]  Pager:  [phone] Office: [phone]

## 2023-03-08 ENCOUNTER — Encounter: Payer: Self-pay | Admitting: Cardiology

## 2023-03-14 ENCOUNTER — Encounter (INDEPENDENT_AMBULATORY_CARE_PROVIDER_SITE_OTHER): Payer: Medicare Other | Admitting: Ophthalmology

## 2023-03-14 DIAGNOSIS — E113513 Type 2 diabetes mellitus with proliferative diabetic retinopathy with macular edema, bilateral: Secondary | ICD-10-CM

## 2023-03-14 DIAGNOSIS — H35033 Hypertensive retinopathy, bilateral: Secondary | ICD-10-CM | POA: Diagnosis not present

## 2023-03-14 DIAGNOSIS — H2513 Age-related nuclear cataract, bilateral: Secondary | ICD-10-CM

## 2023-03-14 DIAGNOSIS — H43813 Vitreous degeneration, bilateral: Secondary | ICD-10-CM

## 2023-03-14 DIAGNOSIS — Z7984 Long term (current) use of oral hypoglycemic drugs: Secondary | ICD-10-CM | POA: Diagnosis not present

## 2023-03-14 DIAGNOSIS — I10 Essential (primary) hypertension: Secondary | ICD-10-CM

## 2023-03-21 ENCOUNTER — Ambulatory Visit (HOSPITAL_COMMUNITY): Payer: Medicare Other

## 2023-03-23 NOTE — Progress Notes (Signed)
Patient is aware she voiced understanding

## 2023-04-12 ENCOUNTER — Encounter (INDEPENDENT_AMBULATORY_CARE_PROVIDER_SITE_OTHER): Payer: Medicare Other | Admitting: Ophthalmology

## 2023-04-12 DIAGNOSIS — I10 Essential (primary) hypertension: Secondary | ICD-10-CM

## 2023-04-12 DIAGNOSIS — H35033 Hypertensive retinopathy, bilateral: Secondary | ICD-10-CM

## 2023-04-12 DIAGNOSIS — H43813 Vitreous degeneration, bilateral: Secondary | ICD-10-CM

## 2023-04-12 DIAGNOSIS — E113513 Type 2 diabetes mellitus with proliferative diabetic retinopathy with macular edema, bilateral: Secondary | ICD-10-CM

## 2023-04-12 DIAGNOSIS — Z7984 Long term (current) use of oral hypoglycemic drugs: Secondary | ICD-10-CM

## 2023-04-19 ENCOUNTER — Telehealth (HOSPITAL_COMMUNITY): Payer: Self-pay | Admitting: Emergency Medicine

## 2023-04-19 DIAGNOSIS — R0602 Shortness of breath: Secondary | ICD-10-CM

## 2023-04-19 DIAGNOSIS — I2510 Atherosclerotic heart disease of native coronary artery without angina pectoris: Secondary | ICD-10-CM

## 2023-04-19 NOTE — Telephone Encounter (Signed)
See note 02/20/2023  Tessa Lerner, DO, [facility]

## 2023-04-24 ENCOUNTER — Telehealth (HOSPITAL_COMMUNITY): Payer: Self-pay | Admitting: *Deleted

## 2023-04-24 NOTE — Telephone Encounter (Signed)
Attempted to call patient regarding upcoming cardiac PET appointment. Left message on voicemail with name and callback number  Larey Brick RN Navigator Cardiac Imaging [facility] [phone] Office [phone] Cell  Reminder to hold caffeine 12 hours prior to her cardiac PET appt.

## 2023-04-24 NOTE — Telephone Encounter (Signed)
Patient returning call about her upcoming cardiac imaging study; pt verbalizes understanding of appt date/time, parking situation and where to check in, pre-test NPO status, and verified current allergies; name and call back number provided for further questions should they arise  Larey Brick RN Navigator Cardiac Imaging [facility] [phone] office [phone] cell  Patient aware to avoid caffeine 12 hours prior to her cardiac PET scan.

## 2023-04-25 ENCOUNTER — Ambulatory Visit (HOSPITAL_COMMUNITY)
Admission: RE | Admit: 2023-04-25 | Discharge: 2023-04-25 | Disposition: A | Discharge status: Home / Self Care | Payer: Medicare Other | Source: Ambulatory Visit | Attending: Cardiology | Admitting: Cardiology

## 2023-04-25 DIAGNOSIS — I2584 Coronary atherosclerosis due to calcified coronary lesion: Secondary | ICD-10-CM | POA: Diagnosis not present

## 2023-04-25 DIAGNOSIS — I2510 Atherosclerotic heart disease of native coronary artery without angina pectoris: Secondary | ICD-10-CM | POA: Diagnosis not present

## 2023-04-25 DIAGNOSIS — R0602 Shortness of breath: Secondary | ICD-10-CM | POA: Diagnosis not present

## 2023-04-25 DIAGNOSIS — I493 Ventricular premature depolarization: Secondary | ICD-10-CM | POA: Diagnosis not present

## 2023-04-25 LAB — NM PET CT CARDIAC PERFUSION MULTI W/ABSOLUTE BLOODFLOW
LV dias vol: 100 mL (ref 46–106)
LV sys vol: 30 mL
MBFR: 2.67
Nuc Rest EF: 66 %
Nuc Stress EF: 77 %
Rest MBF: 1 ml/g/min
Rest Nuclear Isotope Dose: 24.8 mCi
ST Depression (mm): 0 mm
Stress MBF: 2.67 ml/g/min
Stress Nuclear Isotope Dose: 24.9 mCi
TID: 0.82

## 2023-04-25 MED ORDER — REGADENOSON 0.4 MG/5ML IV SOLN
0.4000 mg | Freq: Once | INTRAVENOUS | Status: AC
  Administered 2023-04-25: 0.4 mg via INTRAVENOUS

## 2023-04-25 MED ORDER — RUBIDIUM RB82 GENERATOR (RUBYFILL)
24.5000 | PACK | Freq: Once | INTRAVENOUS | Status: AC
  Administered 2023-04-25: 24.86 via INTRAVENOUS

## 2023-04-25 MED ORDER — REGADENOSON 0.4 MG/5ML IV SOLN
INTRAVENOUS | Status: AC
  Filled 2023-04-25: qty 5

## 2023-04-25 MED ORDER — RUBIDIUM RB82 GENERATOR (RUBYFILL)
24.8000 | PACK | Freq: Once | INTRAVENOUS | Status: AC
  Administered 2023-04-25: 24.82 via INTRAVENOUS

## 2023-04-25 NOTE — Progress Notes (Signed)
Tolerated test well 

## 2023-05-10 ENCOUNTER — Encounter (INDEPENDENT_AMBULATORY_CARE_PROVIDER_SITE_OTHER): Payer: Medicare Other | Admitting: Ophthalmology

## 2023-05-10 DIAGNOSIS — E113513 Type 2 diabetes mellitus with proliferative diabetic retinopathy with macular edema, bilateral: Secondary | ICD-10-CM | POA: Diagnosis not present

## 2023-05-10 DIAGNOSIS — H35033 Hypertensive retinopathy, bilateral: Secondary | ICD-10-CM | POA: Diagnosis not present

## 2023-05-10 DIAGNOSIS — Z7984 Long term (current) use of oral hypoglycemic drugs: Secondary | ICD-10-CM | POA: Diagnosis not present

## 2023-05-10 DIAGNOSIS — H43813 Vitreous degeneration, bilateral: Secondary | ICD-10-CM

## 2023-05-10 DIAGNOSIS — I10 Essential (primary) hypertension: Secondary | ICD-10-CM

## 2023-05-18 ENCOUNTER — Other Ambulatory Visit: Payer: Self-pay | Admitting: Cardiology

## 2023-05-18 DIAGNOSIS — I2510 Atherosclerotic heart disease of native coronary artery without angina pectoris: Secondary | ICD-10-CM

## 2023-05-18 DIAGNOSIS — I493 Ventricular premature depolarization: Secondary | ICD-10-CM

## 2023-05-18 DIAGNOSIS — R0602 Shortness of breath: Secondary | ICD-10-CM

## 2023-05-23 ENCOUNTER — Ambulatory Visit: Payer: Self-pay | Admitting: Cardiology

## 2023-05-30 ENCOUNTER — Other Ambulatory Visit: Payer: Self-pay | Admitting: *Deleted

## 2023-05-30 ENCOUNTER — Other Ambulatory Visit: Payer: Self-pay

## 2023-05-30 DIAGNOSIS — I2510 Atherosclerotic heart disease of native coronary artery without angina pectoris: Secondary | ICD-10-CM

## 2023-05-30 NOTE — Progress Notes (Signed)
Patient came in to office for blood work.  Per notes, placed order for lipids and liver per Dr. Odis Hollingshead.  Sunit Stanley, DO 03/22/2023  7:30 PM EDT     Please help arrange a cardiac PET/CT.   Also have her get fasting lipid profile prior to next office visit after she has been on the new cholesterol medication for 6 weeks.   Sunit Olivet, DO, [facility]

## 2023-05-31 LAB — HEPATIC FUNCTION PANEL
ALT: 16 [IU]/L (ref 0–32)
AST: 19 [IU]/L (ref 0–40)
Albumin: 4.5 g/dL (ref 3.8–4.9)
Alkaline Phosphatase: 61 [IU]/L (ref 44–121)
Bilirubin Total: <0.2 mg/dL (ref 0.0–1.2)
Bilirubin, Direct: 0.1 mg/dL (ref 0.00–0.40)
Total Protein: 7.5 g/dL (ref 6.0–8.5)

## 2023-05-31 LAB — LIPID PANEL
Chol/HDL Ratio: 1.9 ratio (ref 0.0–4.4)
Cholesterol, Total: 173 mg/dL (ref 100–199)
HDL: 93 mg/dL (ref >39)
LDL Chol Calc (NIH): 68 mg/dL (ref 0–99)
Triglycerides: 61 mg/dL (ref 0–149)
VLDL Cholesterol Cal: 12 mg/dL (ref 5–40)

## 2023-06-01 ENCOUNTER — Ambulatory Visit: Payer: Medicare Other | Attending: Cardiology | Admitting: Cardiology

## 2023-06-01 ENCOUNTER — Encounter: Payer: Self-pay | Admitting: Cardiology

## 2023-06-01 VITALS — BP 140/70 | HR 70 | Resp 16 | Ht 65.0 in | Wt 214.8 lb

## 2023-06-01 DIAGNOSIS — I10 Essential (primary) hypertension: Secondary | ICD-10-CM

## 2023-06-01 DIAGNOSIS — I2510 Atherosclerotic heart disease of native coronary artery without angina pectoris: Secondary | ICD-10-CM

## 2023-06-01 DIAGNOSIS — E782 Mixed hyperlipidemia: Secondary | ICD-10-CM

## 2023-06-01 DIAGNOSIS — I2584 Coronary atherosclerosis due to calcified coronary lesion: Secondary | ICD-10-CM

## 2023-06-01 DIAGNOSIS — I493 Ventricular premature depolarization: Secondary | ICD-10-CM

## 2023-06-01 DIAGNOSIS — E119 Type 2 diabetes mellitus without complications: Secondary | ICD-10-CM

## 2023-06-01 MED ORDER — ROSUVASTATIN CALCIUM 40 MG PO TABS: 40.0000 mg | ORAL_TABLET | Freq: Every day | ORAL | 3 refills | Status: DC

## 2023-06-01 MED ORDER — METOPROLOL SUCCINATE ER 50 MG PO TB24: 50.0000 mg | ORAL_TABLET | Freq: Every day | ORAL | 3 refills | Status: DC

## 2023-06-01 NOTE — Progress Notes (Signed)
Cardiology Office Note:  .   Date:  06/01/2023  ID:  Michaela Kim, DOB [birth year], MRN [id number] PCP:  Ralene Ok, MD  Former Cardiology Providers: Dr. Yates Decamp [facility] Providers Cardiologist:  Tessa Lerner, DO , [facility] (established care 12/15/2022) Electrophysiologist:  None  Click to update primary MD,subspecialty MD or APP then REFRESH:1}    Chief Complaint  Patient presents with   Coronary artery calcification   Results    Review cardiac PET/CT results   Follow-up    History of Present Illness: .   Michaela Kim is a 62 y.o. African-American female whose past medical history and cardiovascular risk factors includes: HLD, Diabetes mellitus type 2, history of diabetic retinopathy, hypertension, former smoker.  Patient was referred to the practice for evaluation of shortness of breath, PVCs on EKG and multiple cardiovascular risk factors.  Cardiovascular workup since establishing care as noted severe CAC with a total score of 1035 placing her at the 99th percentile.  She does not have chest pain but shortness of breath has been ongoing.  In addition, her Zio patch noted a PVC burden of approximately 6.2% and echocardiogram noted preserved LVEF.  At the last visit the shared decision was to proceed forward with a cardiac PET/CT to evaluate for reversible ischemia given her severe CAC and also to discontinue pravastatin and start rosuvastatin 20 mg p.o. nightly she presents today for follow-up.  Since last office visit she has not had any anginal chest pain and shortness of breath is improving.  She is tolerated today transition from pravastatin to rosuvastatin and repeat labs with noted improvement in LDL levels.   Review of Systems: .   Review of Systems  Cardiovascular:  Negative for chest pain, claudication, irregular heartbeat, leg swelling, near-syncope, orthopnea, palpitations, paroxysmal nocturnal dyspnea and syncope.  Respiratory:  Negative for  shortness of breath.   Hematologic/Lymphatic: Negative for bleeding problem.    Studies Reviewed:   EKG: December 15, 2022: Normal sinus rhythm, 69 bpm, without underlying ischemia or injury pattern.  Echocardiogram: 01/09/2023: Left ventricle cavity is normal in size. Normal left ventricular wall thickness. Normal global wall motion. Normal LV systolic function with EF 62%. Normal diastolic filling pattern.  Structurally normal trileaflet aortic valve. Mild (Grade I) aortic regurgitation. Mild tricuspid regurgitation. Estimated pulmonary artery systolic pressure 31 mmHg.    Stress Testing: Cardiac PET/CT 04/25/2023  The study is normal. The study is low risk.  LV perfusion is normal. There is no evidence of ischemia. There is no evidence of infarction.  Rest left ventricular function is normal. Rest EF: 66%. Stress EF: 77%. End diastolic cavity size is normal. End systolic cavity size is normal.  Myocardial blood flow was computed to be 1.16ml/g/min at rest and 2.12ml/g/min at stress. Global myocardial blood flow reserve was 2.67 and was normal.  Coronary calcium was present on the attenuation correction CT images. Moderate coronary calcifications were present. Coronary calcifications were present in the left anterior descending artery and right coronary artery distribution(s).  Radiology over read: 1. No acute CT findings of the included chest. 2. Coronary artery disease. 3. Small hiatal hernia. 4. Aortic Atherosclerosis (ICD10-I70.0).    CT Cardiac Scoring: January 23, 2023 Left main 0. LAD 691 LCx 11.8  RCA 332 Coronary calcium score of 1035. This was 99th percentile for age-, race-, and sex-matched controls Aortic atherosclerosis Noncardiac findings: No significant extracardiac findings within the visualized chest.   Cardiac monitor (Zio Patch): January 09, 2023 -January 16, 2023 Dominant rhythm sinus. Heart rate 45-125 bpm. Avg HR 67 bpm. No atrial fibrillation, supraventricular  tachycardia, ventricular tachycardia, high grade AV block, pauses (3 seconds or longer). Total ventricular ectopic burden 6.2% (predominantly of isolated beats). Total supraventricular ectopic burden <1%. Patient triggered events: 3. Sinus rhythm with PVCs.   RADIOLOGY: N/A  Risk Assessment/Calculations:   N/A   Labs:    Lab Results  Component Value Date   CHOL 173 05/30/2023   HDL 93 05/30/2023   LDLCALC 68 05/30/2023   TRIG 61 05/30/2023   CHOLHDL 1.9 05/30/2023   External Labs: Collected: 02/21/2023: Hemoglobin 10.8. BUN 23, creatinine 1.26. eGFR 49. Sodium 139, potassium 4.9, chloride 106, bicarb 18 AST 22, ALT 14, alkaline phosphatase 62. Total cholesterol 202, triglycerides 68, HDL 83, LDL calculated 103 A1c 6.6  Physical Exam:    Today's Vitals   06/01/23 0954  BP: (!) 140/70  Pulse: 70  Resp: 16  SpO2: 99%  Weight: 214 lb 12.8 oz (97.4 kg)  Height: 5\' 5"  (1.651 m)   Body mass index is 35.74 kg/m. Wt Readings from Last 3 Encounters:  06/01/23 214 lb 12.8 oz (97.4 kg)  02/20/23 211 lb (95.7 kg)  12/15/22 208 lb (94.3 kg)    Physical Exam  Constitutional: No distress.  Age appropriate, hemodynamically stable.   Neck: No JVD present.  Cardiovascular: Normal rate, regular rhythm, S1 normal, S2 normal, intact distal pulses and normal pulses. Exam reveals no gallop, no S3 and no S4.  No murmur heard. Pulses:      Dorsalis pedis pulses are 2+ on the right side and 2+ on the left side.       Posterior tibial pulses are 2+ on the right side and 2+ on the left side.  Pulmonary/Chest: Effort normal and breath sounds normal. No stridor. She has no wheezes. She has no rales.  Abdominal: Soft. Bowel sounds are normal. She exhibits no distension. There is no abdominal tenderness.  Musculoskeletal:        General: No edema.     Cervical back: Neck supple.  Neurological: She is alert and oriented to person, place, and time. She has intact cranial nerves (2-12).   Skin: Skin is warm and moist.   Impression & Recommendation(s):  Impression:   ICD-10-CM   1. Coronary atherosclerosis due to calcified coronary lesion  I25.10 rosuvastatin (CRESTOR) 40 MG tablet   I25.84 Comprehensive metabolic panel    LDL cholesterol, direct    Lipid panel    Lipid panel    LDL cholesterol, direct    Comprehensive metabolic panel    2. PVC's (premature ventricular contractions)  I49.3 metoprolol succinate (TOPROL XL) 50 MG 24 hr tablet    3. Benign hypertension  I10     4. Non-insulin dependent type 2 diabetes mellitus (HCC)  E11.9     5. Mixed hyperlipidemia  E78.2        Recommendation(s):  Coronary atherosclerosis due to calcified coronary lesion Severe CAC, total score 1035, 99 percentile. Cardiac PET/CT-low risk study.  See report for additional details Denies anginal chest pain. Shortness of breath improving. Tolerated the transition from pravastatin to Crestor.  LDL levels have improved as of November 2024, not at goal.  Recommended goal LDL <55 mg/dL in the setting of diabetes and severe CAC Reemphasized the importance of secondary prevention with focus on improving her modifiable cardiovascular risk factors such as glycemic control, lipid management, blood pressure control, weight loss.  PVC's (premature ventricular contractions) Remains  asymptomatic. Prior Zio patch noted a PVC burden of approximately 6.2%. Currently on Lopressor 25 mg p.o. twice daily. Transition to Toprol-XL 50 mg p.o. daily  Benign hypertension Office blood pressures are not at goal. Patient states that both her primary care provider and nephrology have recommended a top number to be closer to 130 mmHg.  I agree. Reemphasized importance of low-salt diet. Will defer blood pressure management to both PCP and/or nephrology.  Non-insulin dependent type 2 diabetes mellitus (HCC) Hemoglobin A1c is well-controlled. Continue current medical therapy. Already on lisinopril,  Farxiga, statin therapy. Consider Ozempic in the setting of diabetes, obesity, and severe CAC-patient discussed with PCP.  Mixed hyperlipidemia Used to be on pravastatin at that time her LDL levels were closer to 440 mg/dL. Given her CAC she was transition from pravastatin to Crestor 20 mg p.o. nightly in August 2024-repeat lipids in November 2024 notes LDL to be 68 mg/dL. We discussed up titration of medical therapy versus working on lifestyle changes to lose weight and to reevaluate lipids.  Patient would like to uptitrate medical therapy for now and we will also implement lifestyle changes to help facilitate weight loss. Discontinue Crestor 20 mg p.o. nightly Start Crestor 40 mg p.o. nightly. Will check fasting lipid profile and direct LDL and CMP in 6 weeks  Orders Placed:  Orders Placed This Encounter  Procedures   Comprehensive metabolic panel    Standing Status:   Future    Number of Occurrences:   1    Standing Expiration Date:   05/31/2024    Order Specific Question:   Has the patient fasted?    Answer:   Yes   LDL cholesterol, direct    Standing Status:   Future    Number of Occurrences:   1    Standing Expiration Date:   05/31/2024   Lipid panel    Standing Status:   Future    Number of Occurrences:   1    Standing Expiration Date:   05/31/2024    Order Specific Question:   Has the patient fasted?    Answer:   Yes    As part of medical decision making cardiac PET/CT , labs from November 2024, echocardiogram, were reviewed during today's encounter  Final Medication List:    Meds ordered this encounter  Medications   rosuvastatin (CRESTOR) 40 MG tablet    Sig: Take 1 tablet (40 mg total) by mouth daily.    Dispense:  90 tablet    Refill:  3   metoprolol succinate (TOPROL XL) 50 MG 24 hr tablet    Sig: Take 1 tablet (50 mg total) by mouth daily. Take with or immediately following a meal.    Dispense:  90 tablet    Refill:  3    Switched from Lopressor to  Toprol-XL    Medications Discontinued During This Encounter  Medication Reason   metoprolol tartrate (LOPRESSOR) 25 MG tablet Discontinued by provider   rosuvastatin (CRESTOR) 20 MG tablet Dose change     Current Outpatient Medications:    acetaminophen (TYLENOL) 325 MG tablet, Take 325-650 mg by mouth every 6 (six) hours as needed for mild pain. , Disp: , Rfl:    amLODipine (NORVASC) 10 MG tablet, Take 10 mg by mouth daily., Disp: , Rfl:    cholecalciferol (VITAMIN D3) 25 MCG (1000 UNIT) tablet, Take 1,000 Units by mouth daily., Disp: , Rfl:    ciprofloxacin (CILOXAN) 0.3 % ophthalmic solution, Place 1 drop into both  eyes every 2 (two) hours., Disp: , Rfl:    dapagliflozin propanediol (FARXIGA) 10 MG TABS tablet, Take by mouth daily., Disp: , Rfl:    diphenhydrAMINE (BENADRYL) 25 mg capsule, Take 25 mg by mouth every 6 (six) hours as needed., Disp: , Rfl:    ferrous sulfate 325 (65 FE) MG EC tablet, Take 325 mg by mouth daily with breakfast., Disp: , Rfl:    lisinopril-hydrochlorothiazide (PRINZIDE,ZESTORETIC) 20-12.5 MG tablet, Take 2 tablets by mouth daily., Disp: , Rfl: 5   metFORMIN (GLUCOPHAGE) 1000 MG tablet, Take 1,000 mg by mouth 2 (two) times daily., Disp: , Rfl: 6   metoprolol succinate (TOPROL XL) 50 MG 24 hr tablet, Take 1 tablet (50 mg total) by mouth daily. Take with or immediately following a meal., Disp: 90 tablet, Rfl: 3   montelukast (SINGULAIR) 10 MG tablet, Take 10 mg by mouth at bedtime., Disp: , Rfl:    Multiple Vitamins-Minerals (SENTRY SENIOR PO), Take 1 tablet by mouth daily., Disp: , Rfl:    pyridOXINE (VITAMIN B-6) 100 MG tablet, Take 100 mg by mouth daily., Disp: , Rfl:    rosuvastatin (CRESTOR) 40 MG tablet, Take 1 tablet (40 mg total) by mouth daily., Disp: 90 tablet, Rfl: 3   Vitamin A 2400 MCG (8000 UT) TABS, Take 1 tablet by mouth daily at 2 PM., Disp: , Rfl:    vitamin B-12 (CYANOCOBALAMIN) 500 MCG tablet, Take 500 mcg by mouth daily., Disp: , Rfl:    Consent:   N/A  Disposition:   1 year sooner if needed. Patient may be asked to follow-up sooner based on the results of the above-mentioned testing.  Her questions and concerns were addressed to her satisfaction. She voices understanding of the recommendations provided during this encounter.    Signed, Tessa Lerner, DO, [facility]  [facility]  [address] [place], [place] [address] 06/01/2023 10:41 AM

## 2023-06-01 NOTE — Patient Instructions (Signed)
Medication Instructions:  Your physician has recommended you make the following change in your medication:   STOP Metoprolol Tartrate (Lopressor)   START Metoprolol Succinate (Toprol-XL) 50 mg once daily   INCREASE Crestor to 40 mg once daily    *If you need a refill on your cardiac medications before your next appointment, please call your pharmacy*  Lab Work: To be completed in 6 weeks: FASTING lipid panel, direct LDL, and CMP  If you have labs (blood work) drawn today and your tests are completely normal, you will receive your results only by: MyChart Message (if you have MyChart) OR A paper copy in the mail If you have any lab test that is abnormal or we need to change your treatment, we will call you to review the results.  Testing/Procedures: None ordered today.  Follow-Up: At [facility], you and your health needs are our priority.  As part of our continuing mission to provide you with exceptional heart care, we have created designated Provider Care Teams.  These Care Teams include your primary Cardiologist (physician) and Advanced Practice Providers (APPs -  Physician Assistants and Nurse Practitioners) who all work together to provide you with the care you need, when you need it.  We recommend signing up for the patient portal called "MyChart".  Sign up information is provided on this After Visit Summary.  MyChart is used to connect with patients for Virtual Visits (Telemedicine).  Patients are able to view lab/test results, encounter notes, upcoming appointments, etc.  Non-urgent messages can be sent to your provider as well.   To learn more about what you can do with MyChart, go to [email or web address].    Your next appointment:   1 year(s)  The format for your next appointment:   In Person  Provider:   Tessa Lerner, DO {

## 2023-06-07 ENCOUNTER — Encounter (INDEPENDENT_AMBULATORY_CARE_PROVIDER_SITE_OTHER): Payer: Medicare Other | Admitting: Ophthalmology

## 2023-06-13 ENCOUNTER — Encounter (INDEPENDENT_AMBULATORY_CARE_PROVIDER_SITE_OTHER): Payer: Medicare Other | Admitting: Ophthalmology

## 2023-06-13 DIAGNOSIS — H35033 Hypertensive retinopathy, bilateral: Secondary | ICD-10-CM

## 2023-06-13 DIAGNOSIS — H43813 Vitreous degeneration, bilateral: Secondary | ICD-10-CM

## 2023-06-13 DIAGNOSIS — Z7984 Long term (current) use of oral hypoglycemic drugs: Secondary | ICD-10-CM

## 2023-06-13 DIAGNOSIS — I10 Essential (primary) hypertension: Secondary | ICD-10-CM

## 2023-06-13 DIAGNOSIS — E113513 Type 2 diabetes mellitus with proliferative diabetic retinopathy with macular edema, bilateral: Secondary | ICD-10-CM

## 2023-07-18 ENCOUNTER — Encounter (INDEPENDENT_AMBULATORY_CARE_PROVIDER_SITE_OTHER): Payer: Medicare Other | Admitting: Ophthalmology

## 2023-07-18 ENCOUNTER — Other Ambulatory Visit: Payer: Self-pay | Admitting: Internal Medicine

## 2023-07-18 DIAGNOSIS — Z0000 Encounter for general adult medical examination without abnormal findings: Secondary | ICD-10-CM

## 2023-07-25 ENCOUNTER — Encounter (INDEPENDENT_AMBULATORY_CARE_PROVIDER_SITE_OTHER): Payer: Medicare Other | Admitting: Ophthalmology

## 2023-07-25 DIAGNOSIS — H43813 Vitreous degeneration, bilateral: Secondary | ICD-10-CM

## 2023-07-25 DIAGNOSIS — H35033 Hypertensive retinopathy, bilateral: Secondary | ICD-10-CM | POA: Diagnosis not present

## 2023-07-25 DIAGNOSIS — I10 Essential (primary) hypertension: Secondary | ICD-10-CM

## 2023-07-25 DIAGNOSIS — E113513 Type 2 diabetes mellitus with proliferative diabetic retinopathy with macular edema, bilateral: Secondary | ICD-10-CM | POA: Diagnosis not present

## 2023-07-25 DIAGNOSIS — Z7984 Long term (current) use of oral hypoglycemic drugs: Secondary | ICD-10-CM

## 2023-08-29 ENCOUNTER — Encounter (INDEPENDENT_AMBULATORY_CARE_PROVIDER_SITE_OTHER): Payer: Medicare Other | Admitting: Ophthalmology

## 2023-09-04 ENCOUNTER — Encounter (INDEPENDENT_AMBULATORY_CARE_PROVIDER_SITE_OTHER): Payer: Medicare Other | Admitting: Ophthalmology

## 2023-09-04 DIAGNOSIS — Z7984 Long term (current) use of oral hypoglycemic drugs: Secondary | ICD-10-CM

## 2023-09-04 DIAGNOSIS — E113513 Type 2 diabetes mellitus with proliferative diabetic retinopathy with macular edema, bilateral: Secondary | ICD-10-CM

## 2023-09-04 DIAGNOSIS — H35033 Hypertensive retinopathy, bilateral: Secondary | ICD-10-CM | POA: Diagnosis not present

## 2023-09-04 DIAGNOSIS — H2513 Age-related nuclear cataract, bilateral: Secondary | ICD-10-CM

## 2023-09-04 DIAGNOSIS — I10 Essential (primary) hypertension: Secondary | ICD-10-CM | POA: Diagnosis not present

## 2023-09-04 DIAGNOSIS — H43813 Vitreous degeneration, bilateral: Secondary | ICD-10-CM

## 2023-09-12 ENCOUNTER — Ambulatory Visit: Payer: Medicare Other

## 2023-10-03 ENCOUNTER — Ambulatory Visit
Admission: RE | Admit: 2023-10-03 | Discharge: 2023-10-03 | Disposition: A | Discharge status: Home / Self Care | Source: Ambulatory Visit | Attending: Internal Medicine | Admitting: Internal Medicine

## 2023-10-03 DIAGNOSIS — Z0000 Encounter for general adult medical examination without abnormal findings: Secondary | ICD-10-CM

## 2023-10-05 ENCOUNTER — Other Ambulatory Visit: Payer: Self-pay | Admitting: Internal Medicine

## 2023-10-05 DIAGNOSIS — R928 Other abnormal and inconclusive findings on diagnostic imaging of breast: Secondary | ICD-10-CM

## 2023-10-09 ENCOUNTER — Encounter (INDEPENDENT_AMBULATORY_CARE_PROVIDER_SITE_OTHER): Payer: Medicare Other | Admitting: Ophthalmology

## 2023-10-16 ENCOUNTER — Encounter

## 2023-10-17 ENCOUNTER — Encounter (INDEPENDENT_AMBULATORY_CARE_PROVIDER_SITE_OTHER): Admitting: Ophthalmology

## 2023-10-17 DIAGNOSIS — I10 Essential (primary) hypertension: Secondary | ICD-10-CM

## 2023-10-17 DIAGNOSIS — H35033 Hypertensive retinopathy, bilateral: Secondary | ICD-10-CM | POA: Diagnosis not present

## 2023-10-17 DIAGNOSIS — H43813 Vitreous degeneration, bilateral: Secondary | ICD-10-CM

## 2023-10-17 DIAGNOSIS — E113513 Type 2 diabetes mellitus with proliferative diabetic retinopathy with macular edema, bilateral: Secondary | ICD-10-CM | POA: Diagnosis not present

## 2023-10-17 DIAGNOSIS — Z7984 Long term (current) use of oral hypoglycemic drugs: Secondary | ICD-10-CM | POA: Diagnosis not present

## 2023-10-19 ENCOUNTER — Ambulatory Visit
Admission: RE | Admit: 2023-10-19 | Discharge: 2023-10-19 | Disposition: A | Discharge status: Home / Self Care | Source: Ambulatory Visit | Attending: Internal Medicine | Admitting: Internal Medicine

## 2023-10-19 ENCOUNTER — Other Ambulatory Visit: Payer: Self-pay | Admitting: Internal Medicine

## 2023-10-19 DIAGNOSIS — R921 Mammographic calcification found on diagnostic imaging of breast: Secondary | ICD-10-CM

## 2023-10-19 DIAGNOSIS — R928 Other abnormal and inconclusive findings on diagnostic imaging of breast: Secondary | ICD-10-CM

## 2023-10-26 ENCOUNTER — Ambulatory Visit
Admission: RE | Admit: 2023-10-26 | Discharge: 2023-10-26 | Disposition: A | Discharge status: Home / Self Care | Source: Ambulatory Visit | Attending: Internal Medicine | Admitting: Internal Medicine

## 2023-10-26 DIAGNOSIS — R921 Mammographic calcification found on diagnostic imaging of breast: Secondary | ICD-10-CM

## 2023-10-26 HISTORY — PX: BREAST BIOPSY: SHX20

## 2023-10-27 LAB — SURGICAL PATHOLOGY

## 2023-11-14 ENCOUNTER — Encounter (INDEPENDENT_AMBULATORY_CARE_PROVIDER_SITE_OTHER): Admitting: Ophthalmology

## 2023-11-14 DIAGNOSIS — I10 Essential (primary) hypertension: Secondary | ICD-10-CM | POA: Diagnosis not present

## 2023-11-14 DIAGNOSIS — Z7984 Long term (current) use of oral hypoglycemic drugs: Secondary | ICD-10-CM | POA: Diagnosis not present

## 2023-11-14 DIAGNOSIS — H35033 Hypertensive retinopathy, bilateral: Secondary | ICD-10-CM | POA: Diagnosis not present

## 2023-11-14 DIAGNOSIS — E113513 Type 2 diabetes mellitus with proliferative diabetic retinopathy with macular edema, bilateral: Secondary | ICD-10-CM

## 2023-11-14 DIAGNOSIS — H43813 Vitreous degeneration, bilateral: Secondary | ICD-10-CM

## 2023-12-12 ENCOUNTER — Encounter (INDEPENDENT_AMBULATORY_CARE_PROVIDER_SITE_OTHER): Admitting: Ophthalmology

## 2023-12-12 DIAGNOSIS — H35033 Hypertensive retinopathy, bilateral: Secondary | ICD-10-CM

## 2023-12-12 DIAGNOSIS — I10 Essential (primary) hypertension: Secondary | ICD-10-CM

## 2023-12-12 DIAGNOSIS — H2513 Age-related nuclear cataract, bilateral: Secondary | ICD-10-CM

## 2023-12-12 DIAGNOSIS — Z7984 Long term (current) use of oral hypoglycemic drugs: Secondary | ICD-10-CM

## 2023-12-12 DIAGNOSIS — E113513 Type 2 diabetes mellitus with proliferative diabetic retinopathy with macular edema, bilateral: Secondary | ICD-10-CM | POA: Diagnosis not present

## 2023-12-12 DIAGNOSIS — H43813 Vitreous degeneration, bilateral: Secondary | ICD-10-CM

## 2024-01-10 ENCOUNTER — Encounter (INDEPENDENT_AMBULATORY_CARE_PROVIDER_SITE_OTHER): Admitting: Ophthalmology

## 2024-01-10 DIAGNOSIS — I10 Essential (primary) hypertension: Secondary | ICD-10-CM

## 2024-01-10 DIAGNOSIS — H35033 Hypertensive retinopathy, bilateral: Secondary | ICD-10-CM

## 2024-01-10 DIAGNOSIS — E113513 Type 2 diabetes mellitus with proliferative diabetic retinopathy with macular edema, bilateral: Secondary | ICD-10-CM

## 2024-01-10 DIAGNOSIS — Z7984 Long term (current) use of oral hypoglycemic drugs: Secondary | ICD-10-CM

## 2024-01-10 DIAGNOSIS — H43813 Vitreous degeneration, bilateral: Secondary | ICD-10-CM

## 2024-02-14 ENCOUNTER — Encounter (INDEPENDENT_AMBULATORY_CARE_PROVIDER_SITE_OTHER): Admitting: Ophthalmology

## 2024-02-15 ENCOUNTER — Encounter (INDEPENDENT_AMBULATORY_CARE_PROVIDER_SITE_OTHER): Admitting: Ophthalmology

## 2024-02-15 DIAGNOSIS — Z7984 Long term (current) use of oral hypoglycemic drugs: Secondary | ICD-10-CM

## 2024-02-15 DIAGNOSIS — H35033 Hypertensive retinopathy, bilateral: Secondary | ICD-10-CM | POA: Diagnosis not present

## 2024-02-15 DIAGNOSIS — E113513 Type 2 diabetes mellitus with proliferative diabetic retinopathy with macular edema, bilateral: Secondary | ICD-10-CM

## 2024-02-15 DIAGNOSIS — I10 Essential (primary) hypertension: Secondary | ICD-10-CM | POA: Diagnosis not present

## 2024-02-15 DIAGNOSIS — H43813 Vitreous degeneration, bilateral: Secondary | ICD-10-CM

## 2024-02-27 ENCOUNTER — Other Ambulatory Visit: Payer: Self-pay | Admitting: Internal Medicine

## 2024-02-27 DIAGNOSIS — Z9889 Other specified postprocedural states: Secondary | ICD-10-CM

## 2024-03-04 ENCOUNTER — Encounter: Payer: Self-pay | Admitting: Internal Medicine

## 2024-03-14 ENCOUNTER — Encounter (INDEPENDENT_AMBULATORY_CARE_PROVIDER_SITE_OTHER): Admitting: Ophthalmology

## 2024-03-14 DIAGNOSIS — E113513 Type 2 diabetes mellitus with proliferative diabetic retinopathy with macular edema, bilateral: Secondary | ICD-10-CM | POA: Diagnosis not present

## 2024-03-14 DIAGNOSIS — H35033 Hypertensive retinopathy, bilateral: Secondary | ICD-10-CM | POA: Diagnosis not present

## 2024-03-14 DIAGNOSIS — H43813 Vitreous degeneration, bilateral: Secondary | ICD-10-CM

## 2024-03-14 DIAGNOSIS — I10 Essential (primary) hypertension: Secondary | ICD-10-CM

## 2024-03-14 DIAGNOSIS — Z7984 Long term (current) use of oral hypoglycemic drugs: Secondary | ICD-10-CM | POA: Diagnosis not present

## 2024-04-10 ENCOUNTER — Encounter (INDEPENDENT_AMBULATORY_CARE_PROVIDER_SITE_OTHER): Admitting: Ophthalmology

## 2024-04-10 DIAGNOSIS — I10 Essential (primary) hypertension: Secondary | ICD-10-CM

## 2024-04-10 DIAGNOSIS — Z7984 Long term (current) use of oral hypoglycemic drugs: Secondary | ICD-10-CM

## 2024-04-10 DIAGNOSIS — H2513 Age-related nuclear cataract, bilateral: Secondary | ICD-10-CM

## 2024-04-10 DIAGNOSIS — E113513 Type 2 diabetes mellitus with proliferative diabetic retinopathy with macular edema, bilateral: Secondary | ICD-10-CM | POA: Diagnosis not present

## 2024-04-10 DIAGNOSIS — H35033 Hypertensive retinopathy, bilateral: Secondary | ICD-10-CM | POA: Diagnosis not present

## 2024-04-10 DIAGNOSIS — H43813 Vitreous degeneration, bilateral: Secondary | ICD-10-CM

## 2024-04-29 ENCOUNTER — Ambulatory Visit
Admission: RE | Admit: 2024-04-29 | Discharge: 2024-04-29 | Disposition: A | Discharge status: Home / Self Care | Source: Ambulatory Visit | Attending: Internal Medicine | Admitting: Internal Medicine

## 2024-04-29 DIAGNOSIS — Z9889 Other specified postprocedural states: Secondary | ICD-10-CM

## 2024-05-08 ENCOUNTER — Encounter (INDEPENDENT_AMBULATORY_CARE_PROVIDER_SITE_OTHER): Admitting: Ophthalmology

## 2024-05-13 ENCOUNTER — Encounter (INDEPENDENT_AMBULATORY_CARE_PROVIDER_SITE_OTHER): Admitting: Ophthalmology

## 2024-05-13 DIAGNOSIS — Z7984 Long term (current) use of oral hypoglycemic drugs: Secondary | ICD-10-CM

## 2024-05-13 DIAGNOSIS — H35033 Hypertensive retinopathy, bilateral: Secondary | ICD-10-CM | POA: Diagnosis not present

## 2024-05-13 DIAGNOSIS — E113513 Type 2 diabetes mellitus with proliferative diabetic retinopathy with macular edema, bilateral: Secondary | ICD-10-CM

## 2024-05-13 DIAGNOSIS — H43813 Vitreous degeneration, bilateral: Secondary | ICD-10-CM

## 2024-05-13 DIAGNOSIS — I10 Essential (primary) hypertension: Secondary | ICD-10-CM

## 2024-05-14 ENCOUNTER — Other Ambulatory Visit: Payer: Self-pay | Admitting: Cardiology

## 2024-05-14 DIAGNOSIS — I493 Ventricular premature depolarization: Secondary | ICD-10-CM

## 2024-05-17 ENCOUNTER — Telehealth: Payer: Self-pay | Admitting: Cardiology

## 2024-05-17 ENCOUNTER — Other Ambulatory Visit: Payer: Self-pay | Admitting: Cardiology

## 2024-05-17 DIAGNOSIS — I2510 Atherosclerotic heart disease of native coronary artery without angina pectoris: Secondary | ICD-10-CM

## 2024-05-17 MED ORDER — ROSUVASTATIN CALCIUM 40 MG PO TABS: 40.0000 mg | ORAL_TABLET | Freq: Every day | ORAL | 0 refills | Status: AC

## 2024-05-17 NOTE — Telephone Encounter (Signed)
 Pt has been scheduled to see Dr. Michele 06/13/24.  Pt aware a 30 day supply will be sent to [facility].

## 2024-05-17 NOTE — Telephone Encounter (Signed)
*  STAT* If patient is at the pharmacy, call can be transferred to refill team.   1. Which medications need to be refilled? (please list name of each medication and dose if known)   rosuvastatin  (CRESTOR ) 40 MG tablet (Expired)    2. Which pharmacy/location (including street and city if local pharmacy) is medication to be sent to?  [id number] - [place], [place] - [address] AT [facility] [address]      3. Do they need a 30 day or 90 day supply? 90 day    Pt is out of medication

## 2024-05-20 ENCOUNTER — Other Ambulatory Visit (HOSPITAL_COMMUNITY): Payer: Self-pay

## 2024-05-20 ENCOUNTER — Telehealth: Payer: Self-pay | Admitting: Cardiology

## 2024-05-20 NOTE — Telephone Encounter (Signed)
 Pharmacy Patient Advocate Encounter   Received notification from Physician's Office that prior authorization for ROSUVASTATIN  is required/requested.   Insurance verification completed.   The patient is insured through [employer].   Per test claim: The current 30 day co-pay is, $0.  No PA needed at this time. This test claim was processed through [facility]- copay amounts may vary at other pharmacies due to pharmacy/plan contracts, or as the patient moves through the different stages of their insurance plan.

## 2024-05-20 NOTE — Telephone Encounter (Signed)
 Pt c/o medication issue:  1. Name of Medication:  rosuvastatin  (CRESTOR ) 40 MG tablet  2. How are you currently taking this medication (dosage and times per day)?   3. Are you having a reaction (difficulty breathing--STAT)?   4. What is your medication issue?   Patient says [facility] informed her that a PA is needed for them to distribute her medication. Please advise.

## 2024-05-20 NOTE — Telephone Encounter (Signed)
 Pt's medication was sent to pt's pharmacy as requested, no prior auth needed at this time per prior auth team. Confirmation received.

## 2024-05-20 NOTE — Telephone Encounter (Signed)
 Will send message to rx prior auth team.

## 2024-06-11 ENCOUNTER — Encounter (INDEPENDENT_AMBULATORY_CARE_PROVIDER_SITE_OTHER): Admitting: Ophthalmology

## 2024-06-11 DIAGNOSIS — H35033 Hypertensive retinopathy, bilateral: Secondary | ICD-10-CM | POA: Diagnosis not present

## 2024-06-11 DIAGNOSIS — H43813 Vitreous degeneration, bilateral: Secondary | ICD-10-CM

## 2024-06-11 DIAGNOSIS — Z7984 Long term (current) use of oral hypoglycemic drugs: Secondary | ICD-10-CM

## 2024-06-11 DIAGNOSIS — H2513 Age-related nuclear cataract, bilateral: Secondary | ICD-10-CM

## 2024-06-11 DIAGNOSIS — I10 Essential (primary) hypertension: Secondary | ICD-10-CM | POA: Diagnosis not present

## 2024-06-11 DIAGNOSIS — E113513 Type 2 diabetes mellitus with proliferative diabetic retinopathy with macular edema, bilateral: Secondary | ICD-10-CM | POA: Diagnosis not present

## 2024-06-13 ENCOUNTER — Encounter: Payer: Self-pay | Admitting: Cardiology

## 2024-06-13 ENCOUNTER — Ambulatory Visit: Attending: Cardiology | Admitting: Cardiology

## 2024-06-13 VITALS — BP 112/70 | HR 70 | Resp 16 | Ht 65.0 in | Wt 197.6 lb

## 2024-06-13 DIAGNOSIS — I493 Ventricular premature depolarization: Secondary | ICD-10-CM

## 2024-06-13 DIAGNOSIS — I2584 Coronary atherosclerosis due to calcified coronary lesion: Secondary | ICD-10-CM

## 2024-06-13 DIAGNOSIS — I10 Essential (primary) hypertension: Secondary | ICD-10-CM

## 2024-06-13 DIAGNOSIS — E119 Type 2 diabetes mellitus without complications: Secondary | ICD-10-CM | POA: Diagnosis not present

## 2024-06-13 DIAGNOSIS — I2510 Atherosclerotic heart disease of native coronary artery without angina pectoris: Secondary | ICD-10-CM | POA: Diagnosis not present

## 2024-06-13 DIAGNOSIS — E782 Mixed hyperlipidemia: Secondary | ICD-10-CM

## 2024-06-13 MED ORDER — ASPIRIN 81 MG PO TBEC: 81.0000 mg | DELAYED_RELEASE_TABLET | Freq: Every day | ORAL | Status: AC

## 2024-06-13 NOTE — Patient Instructions (Signed)
 Medication Instructions:  START taking Aspirin 81 mg, take one (1) tablet by mouth once daily.  *If you need a refill on your cardiac medications before your next appointment, please call your pharmacy*  Lab Work: None ordered If you have labs (blood work) drawn today and your tests are completely normal, you will receive your results only by: MyChart Message (if you have MyChart) OR A paper copy in the mail If you have any lab test that is abnormal or we need to change your treatment, we will call you to review the results.  Testing/Procedures: None ordered  Follow-Up: At [facility], you and your health needs are our priority.  As part of our continuing mission to provide you with exceptional heart care, our providers are all part of one team.  This team includes your primary Cardiologist (physician) and Advanced Practice Providers or APPs (Physician Assistants and Nurse Practitioners) who all work together to provide you with the care you need, when you need it.  Your next appointment:   1 year(s)  Provider:   Madonna Large, DO    We recommend signing up for the patient portal called MyChart.  Sign up information is provided on this After Visit Summary.  MyChart is used to connect with patients for Virtual Visits (Telemedicine).  Patients are able to view lab/test results, encounter notes, upcoming appointments, etc.  Non-urgent messages can be sent to your provider as well.   To learn more about what you can do with MyChart, go to [email or web address].

## 2024-06-13 NOTE — Progress Notes (Signed)
 Cardiology Office Note:  .   Date:  06/13/2024  ID:  Michaela Kim, DOB 26-Nov-1960, MRN [id number] PCP:  Valma Carwin, MD  Former Cardiology Providers: Dr. Gordy [facility] Providers Cardiologist:  Madonna Large, DO , [facility] (established care 12/15/2022) Electrophysiologist:  None  Click to update primary MD,subspecialty MD or APP then REFRESH:1}    Chief Complaint  Patient presents with   Coronary atherosclerosis due to calcified coronary lesion   Follow-up    History of Present Illness: .   Michaela Kim is a 63 y.o. African-American female whose past medical history and cardiovascular risk factors includes: Severe coronary artery calcification, aortic atherosclerosis, HLD, Diabetes mellitus type 2, history of diabetic retinopathy, hypertension, former smoker.  Patient was referred to the practice for evaluation of shortness of breath, PVCs on EKG and multiple cardiovascular risk factors.  Cardiovascular workup since establishing care as noted severe CAC with a total score of 1035 placing her at the 99th percentile.  No prior history of chest pain.  But did have shortness of breath.  Her Zio patch noted a PVC burden of approximately 6.2% and echocardiogram noted preserved LVEF.  Given her severe CAC patient was transition from pravastatin to rosuvastatin  20 mg p.o. daily.  Cardiac PET/CT was recommended to evaluate for reversible ischemia in the setting of severe CAC and frequent PVCs.  Patient presents today for 1 year follow-up visit.  Since last office visit Michaela Kim denies any anginal chest pain or heart failure symptoms.   No hospitalizations or urgent care visits for cardiovascular reasons.   She has been compliant with her medical therapy.   Physical endurance remains stable - goes to gym once a week .  Does not check her BP at home. Compliant with medications, endorses no issues.    Review of Systems: .   Review of Systems   Cardiovascular:  Negative for chest pain, claudication, irregular heartbeat, leg swelling, near-syncope, orthopnea, palpitations, paroxysmal nocturnal dyspnea and syncope.  Respiratory:  Positive for shortness of breath (chronic - improved since initial consult.).   Hematologic/Lymphatic: Negative for bleeding problem.    Studies Reviewed:   EKG: EKG Interpretation Date/Time:  Thursday June 13 2024 09:44:42 EST Ventricular Rate:  67 PR Interval:  174 QRS Duration:  74 QT Interval:  394 QTC Calculation: 416 R Axis:   14  Text Interpretation: Sinus rhythm with occasional Premature ventricular complexes Consider Anterior infarct (cited on or before 14-Jul-2014) When compared with ECG of 14-Jul-2014 11:49, Premature ventricular complexes are now Present Vent. rate has decreased BY  48 BPM Confirmed by Large Madonna [phone]) on 06/13/2024 9:51:44 AM  Echocardiogram: 01/09/2023: Left ventricle cavity is normal in size. Normal left ventricular wall thickness. Normal global wall motion. Normal LV systolic function with EF 62%. Normal diastolic filling pattern.  Structurally normal trileaflet aortic valve. Mild (Grade I) aortic regurgitation. Mild tricuspid regurgitation. Estimated pulmonary artery systolic pressure 31 mmHg.    Stress Testing: Cardiac PET/CT 04/25/2023  The study is normal. The study is low risk.  LV perfusion is normal. There is no evidence of ischemia. There is no evidence of infarction.  Rest left ventricular function is normal. Rest EF: 66%. Stress EF: 77%. End diastolic cavity size is normal. End systolic cavity size is normal.  Myocardial blood flow was computed to be 1.45ml/g/min at rest and 2.67ml/g/min at stress. Global myocardial blood flow reserve was 2.67 and was normal.  Coronary calcium  was present on the attenuation correction  CT images. Moderate coronary calcifications were present. Coronary calcifications were present in the left anterior descending  artery and right coronary artery distribution(s).  Radiology over read: 1. No acute CT findings of the included chest. 2. Coronary artery disease. 3. Small hiatal hernia. 4. Aortic Atherosclerosis (ICD10-I70.0).    CT Cardiac Scoring: January 23, 2023 Left main 0. LAD 691 LCx 11.8  RCA 332 Coronary calcium  score of 1035. This was 99th percentile for age-, race-, and sex-matched controls Aortic atherosclerosis Noncardiac findings: No significant extracardiac findings within the visualized chest.   Cardiac monitor (Zio Patch): January 09, 2023 -January 16, 2023 Dominant rhythm sinus. Heart rate 45-125 bpm. Avg HR 67 bpm. No atrial fibrillation, supraventricular tachycardia, ventricular tachycardia, high grade AV block, pauses (3 seconds or longer). Total ventricular ectopic burden 6.2% (predominantly of isolated beats). Total supraventricular ectopic burden <1%. Patient triggered events: 3. Sinus rhythm with PVCs.   RADIOLOGY: N/A  Risk Assessment/Calculations:   The 10-year ASCVD risk score (Arnett DK, et al., 2019) is: 7.8%   Values used to calculate the score:     Age: 63 years     Clincally relevant sex: Female     Is Non-Hispanic African American: Yes     Diabetic: Yes     Tobacco smoker: No     Systolic Blood Pressure: 112 mmHg     Is BP treated: Yes     HDL Cholesterol: 93 mg/dL     Total Cholesterol: 173 mg/dL  Labs:    Lab Results  Component Value Date   CHOL 173 05/30/2023   HDL 93 05/30/2023   LDLCALC 68 05/30/2023   TRIG 61 05/30/2023   CHOLHDL 1.9 05/30/2023   External Labs: Collected: 02/21/2023: Hemoglobin 10.8. BUN 23, creatinine 1.26. eGFR 49. Sodium 139, potassium 4.9, chloride 106, bicarb 18 AST 22, ALT 14, alkaline phosphatase 62. Total cholesterol 202, triglycerides 68, HDL 83, LDL calculated 103 A1c 6.6  Physical Exam:    Today's Vitals   06/13/24 0947  BP: 112/70  Pulse: 70  Resp: 16  SpO2: 98%  Weight: 197 lb 9.6 oz (89.6 kg)  Height:  5' 5 (1.651 m)    Body mass index is 32.88 kg/m. Wt Readings from Last 3 Encounters:  06/13/24 197 lb 9.6 oz (89.6 kg)  06/01/23 214 lb 12.8 oz (97.4 kg)  02/20/23 211 lb (95.7 kg)    Physical Exam  Constitutional: No distress.  Age appropriate, hemodynamically stable.   Neck: No JVD present.  Cardiovascular: Normal rate, regular rhythm, S1 normal, S2 normal, intact distal pulses and normal pulses. Exam reveals no gallop, no S3 and no S4.  No murmur heard. Pulses:      Dorsalis pedis pulses are 2+ on the right side and 2+ on the left side.       Posterior tibial pulses are 2+ on the right side and 2+ on the left side.  Pulmonary/Chest: Effort normal and breath sounds normal. No stridor. She has no wheezes. She has no rales.  Abdominal: Soft. Bowel sounds are normal. She exhibits no distension. There is no abdominal tenderness.  Musculoskeletal:        General: No edema.     Cervical back: Neck supple.  Neurological: She is alert and oriented to person, place, and time. She has intact cranial nerves (2-12).  Skin: Skin is warm and moist.   Impression & Recommendation(s):  Impression:   ICD-10-CM   1. Coronary atherosclerosis due to calcified coronary lesion  I25.10 EKG  12-Lead   I25.84 aspirin EC 81 MG tablet    2. PVC's (premature ventricular contractions)  I49.3     3. Benign hypertension  I10     4. Non-insulin dependent type 2 diabetes mellitus (HCC)  E11.9     5. Mixed hyperlipidemia  E78.2        Recommendation(s):  Coronary atherosclerosis due to calcified coronary lesion Severe CAC, total score 1035, 99 percentile. Cardiac PET/CT-low risk study.  Denies anginal chest pain. Start aspirin 81 mg p.o. daily. Continue Crestor  40 mg p.o. nightly. Reemphasized importance of improving her modifiable cardiovascular risk factors. No additional cardiovascular testing is warranted at this time  PVC's (premature ventricular contractions) Prior Zio patch noted a PVC  burden of approximately 6.2%. EKG notes rare PVCs. Continue Toprol -XL 50 mg p.o. daily. Clinically much better, per patient  Benign hypertension Office blood pressures are at goal. Does not check home blood pressures. Continue lisinopril/hydrochlorothiazide 20/12.5 mg p.o. daily. Continue Farxiga 10 mg p.o. daily. Continue amlodipine 10 mg p.o. daily  Non-insulin dependent type 2 diabetes mellitus (HCC) Currently on lisinopril, Farxiga, statin therapy. Will have repeat labs with PCP on 06/27/2024 Emphasized the importance of glycemic control  Mixed hyperlipidemia Prior statins: Pravastatin, transitioned due to severe CAC. Currently on rosuvastatin  40 mg p.o. nightly  LDL 68 mg/dL as of November 2024  Will have repeat labs with PCP on 06/27/2024, will send us  a copy of her records  Orders Placed:  Orders Placed This Encounter  Procedures   EKG 12-Lead    Final Medication List:    Meds ordered this encounter  Medications   aspirin EC 81 MG tablet    Sig: Take 1 tablet (81 mg total) by mouth daily. Swallow whole.    There are no discontinued medications.    Current Outpatient Medications:    acetaminophen (TYLENOL) 325 MG tablet, Take 325-650 mg by mouth every 6 (six) hours as needed for mild pain. , Disp: , Rfl:    amLODipine (NORVASC) 10 MG tablet, Take 10 mg by mouth daily., Disp: , Rfl:    aspirin EC 81 MG tablet, Take 1 tablet (81 mg total) by mouth daily. Swallow whole., Disp: , Rfl:    cholecalciferol (VITAMIN D3) 25 MCG (1000 UNIT) tablet, Take 1,000 Units by mouth daily., Disp: , Rfl:    ciprofloxacin (CILOXAN) 0.3 % ophthalmic solution, Place 1 drop into both eyes every 2 (two) hours., Disp: , Rfl:    dapagliflozin propanediol (FARXIGA) 10 MG TABS tablet, Take by mouth daily., Disp: , Rfl:    diphenhydrAMINE (BENADRYL) 25 mg capsule, Take 25 mg by mouth every 6 (six) hours as needed., Disp: , Rfl:    ferrous sulfate 325 (65 FE) MG EC tablet, Take 325 mg by mouth  daily with breakfast., Disp: , Rfl:    lisinopril-hydrochlorothiazide (PRINZIDE,ZESTORETIC) 20-12.5 MG tablet, Take 2 tablets by mouth daily., Disp: , Rfl: 5   metFORMIN (GLUCOPHAGE) 1000 MG tablet, Take 1,000 mg by mouth 2 (two) times daily., Disp: , Rfl: 6   metoprolol  succinate (TOPROL -XL) 50 MG 24 hr tablet, TAKE 1 TABLET(50 MG) BY MOUTH DAILY WITH OR IMMEDIATELY FOLLOWING A MEAL, Disp: 90 tablet, Rfl: 0   montelukast (SINGULAIR) 10 MG tablet, Take 10 mg by mouth at bedtime., Disp: , Rfl:    Multiple Vitamins-Minerals (SENTRY SENIOR PO), Take 1 tablet by mouth daily., Disp: , Rfl:    pyridOXINE (VITAMIN B-6) 100 MG tablet, Take 100 mg by mouth daily., Disp: , Rfl:  rosuvastatin  (CRESTOR ) 40 MG tablet, Take 1 tablet (40 mg total) by mouth daily., Disp: 30 tablet, Rfl: 0   Vitamin A 2400 MCG (8000 UT) TABS, Take 1 tablet by mouth daily at 2 PM., Disp: , Rfl:    vitamin B-12 (CYANOCOBALAMIN) 500 MCG tablet, Take 500 mcg by mouth daily., Disp: , Rfl:   Consent:   N/A  Disposition:   1 year follow-up sooner if needed  Her questions and concerns were addressed to her satisfaction. She voices understanding of the recommendations provided during this encounter.    Signed, Madonna Michele HAS, [facility]  A Division of [facility] [address] [place], [place] [address]  06/13/2024 10:50 AM

## 2024-07-16 ENCOUNTER — Encounter (INDEPENDENT_AMBULATORY_CARE_PROVIDER_SITE_OTHER): Admitting: Ophthalmology

## 2024-07-16 DIAGNOSIS — H35033 Hypertensive retinopathy, bilateral: Secondary | ICD-10-CM

## 2024-07-16 DIAGNOSIS — H43813 Vitreous degeneration, bilateral: Secondary | ICD-10-CM

## 2024-07-16 DIAGNOSIS — Z7984 Long term (current) use of oral hypoglycemic drugs: Secondary | ICD-10-CM

## 2024-07-16 DIAGNOSIS — E113513 Type 2 diabetes mellitus with proliferative diabetic retinopathy with macular edema, bilateral: Secondary | ICD-10-CM | POA: Diagnosis not present

## 2024-07-16 DIAGNOSIS — I10 Essential (primary) hypertension: Secondary | ICD-10-CM

## 2024-07-16 DIAGNOSIS — H2513 Age-related nuclear cataract, bilateral: Secondary | ICD-10-CM

## 2024-08-13 ENCOUNTER — Encounter (INDEPENDENT_AMBULATORY_CARE_PROVIDER_SITE_OTHER): Admitting: Ophthalmology
